# Patient Record
Sex: Male | Born: 1955 | Race: White | Hispanic: Yes | Marital: Married | Smoking: Former smoker
Health system: Southern US, Community
[De-identification: ages and names within clinical notes are randomized; demographics above are authoritative.]

## PROBLEM LIST (undated history)

## (undated) DIAGNOSIS — E119 Type 2 diabetes mellitus without complications: Secondary | ICD-10-CM

## (undated) DIAGNOSIS — Z7289 Other problems related to lifestyle: Secondary | ICD-10-CM

## (undated) DIAGNOSIS — E349 Endocrine disorder, unspecified: Secondary | ICD-10-CM

## (undated) DIAGNOSIS — E559 Vitamin D deficiency, unspecified: Secondary | ICD-10-CM

---

## 1985-06-14 HISTORY — PX: OTHER SURGICAL HISTORY: SHX169

## 2011-08-02 ENCOUNTER — Other Ambulatory Visit: Payer: Self-pay | Admitting: Family Medicine

## 2016-08-15 ENCOUNTER — Inpatient Hospital Stay (HOSPITAL_COMMUNITY)
Admission: EM | Admit: 2016-08-15 | Discharge: 2016-08-20 | DRG: 481 | Disposition: A | Discharge status: Home / Self Care | Payer: Self-pay | Attending: Orthopedic Surgery | Admitting: Orthopedic Surgery

## 2016-08-15 ENCOUNTER — Encounter (HOSPITAL_COMMUNITY): Payer: Self-pay | Admitting: Internal Medicine

## 2016-08-15 ENCOUNTER — Emergency Department (HOSPITAL_COMMUNITY): Payer: Self-pay

## 2016-08-15 DIAGNOSIS — F1410 Cocaine abuse, uncomplicated: Secondary | ICD-10-CM | POA: Diagnosis present

## 2016-08-15 DIAGNOSIS — W010XXA Fall on same level from slipping, tripping and stumbling without subsequent striking against object, initial encounter: Secondary | ICD-10-CM | POA: Diagnosis present

## 2016-08-15 DIAGNOSIS — F1910 Other psychoactive substance abuse, uncomplicated: Secondary | ICD-10-CM | POA: Diagnosis present

## 2016-08-15 DIAGNOSIS — E349 Endocrine disorder, unspecified: Secondary | ICD-10-CM | POA: Diagnosis present

## 2016-08-15 DIAGNOSIS — E559 Vitamin D deficiency, unspecified: Secondary | ICD-10-CM | POA: Diagnosis present

## 2016-08-15 DIAGNOSIS — Z87891 Personal history of nicotine dependence: Secondary | ICD-10-CM

## 2016-08-15 DIAGNOSIS — Z419 Encounter for procedure for purposes other than remedying health state, unspecified: Secondary | ICD-10-CM

## 2016-08-15 DIAGNOSIS — Z789 Other specified health status: Secondary | ICD-10-CM | POA: Diagnosis present

## 2016-08-15 DIAGNOSIS — D62 Acute posthemorrhagic anemia: Secondary | ICD-10-CM | POA: Diagnosis not present

## 2016-08-15 DIAGNOSIS — Y92000 Kitchen of unspecified non-institutional (private) residence as  the place of occurrence of the external cause: Secondary | ICD-10-CM

## 2016-08-15 DIAGNOSIS — N358 Other urethral stricture: Secondary | ICD-10-CM | POA: Diagnosis present

## 2016-08-15 DIAGNOSIS — L280 Lichen simplex chronicus: Secondary | ICD-10-CM | POA: Diagnosis present

## 2016-08-15 DIAGNOSIS — F1020 Alcohol dependence, uncomplicated: Secondary | ICD-10-CM | POA: Diagnosis present

## 2016-08-15 DIAGNOSIS — S72491A Other fracture of lower end of right femur, initial encounter for closed fracture: Principal | ICD-10-CM | POA: Diagnosis present

## 2016-08-15 DIAGNOSIS — Z7289 Other problems related to lifestyle: Secondary | ICD-10-CM | POA: Diagnosis present

## 2016-08-15 DIAGNOSIS — T148XXA Other injury of unspecified body region, initial encounter: Secondary | ICD-10-CM

## 2016-08-15 DIAGNOSIS — E119 Type 2 diabetes mellitus without complications: Secondary | ICD-10-CM | POA: Diagnosis present

## 2016-08-15 DIAGNOSIS — F17201 Nicotine dependence, unspecified, in remission: Secondary | ICD-10-CM

## 2016-08-15 DIAGNOSIS — E291 Testicular hypofunction: Secondary | ICD-10-CM | POA: Diagnosis present

## 2016-08-15 DIAGNOSIS — F17211 Nicotine dependence, cigarettes, in remission: Secondary | ICD-10-CM | POA: Diagnosis present

## 2016-08-15 DIAGNOSIS — M9701XA Periprosthetic fracture around internal prosthetic right hip joint, initial encounter: Secondary | ICD-10-CM | POA: Diagnosis present

## 2016-08-15 DIAGNOSIS — S72409A Unspecified fracture of lower end of unspecified femur, initial encounter for closed fracture: Secondary | ICD-10-CM | POA: Diagnosis present

## 2016-08-15 HISTORY — DX: Endocrine disorder, unspecified: E34.9

## 2016-08-15 HISTORY — DX: Vitamin D deficiency, unspecified: E55.9

## 2016-08-15 HISTORY — DX: Other problems related to lifestyle: Z72.89

## 2016-08-15 HISTORY — DX: Type 2 diabetes mellitus without complications: E11.9

## 2016-08-15 LAB — CBC WITH DIFFERENTIAL/PLATELET
BASOS ABS: 0 10*3/uL (ref 0.0–0.1)
BASOS PCT: 0 %
EOS ABS: 0 10*3/uL (ref 0.0–0.7)
EOS PCT: 0 %
HCT: 41.5 % (ref 39.0–52.0)
HEMOGLOBIN: 14 g/dL (ref 13.0–17.0)
LYMPHS ABS: 1.4 10*3/uL (ref 0.7–4.0)
Lymphocytes Relative: 15 %
MCH: 29.7 pg (ref 26.0–34.0)
MCHC: 33.7 g/dL (ref 30.0–36.0)
MCV: 88.1 fL (ref 78.0–100.0)
Monocytes Absolute: 0.6 10*3/uL (ref 0.1–1.0)
Monocytes Relative: 6 %
NEUTROS PCT: 79 %
Neutro Abs: 7.5 10*3/uL (ref 1.7–7.7)
PLATELETS: 200 10*3/uL (ref 150–400)
RBC: 4.71 MIL/uL (ref 4.22–5.81)
RDW: 12.9 % (ref 11.5–15.5)
WBC: 9.5 10*3/uL (ref 4.0–10.5)

## 2016-08-15 LAB — RAPID URINE DRUG SCREEN, HOSP PERFORMED
Amphetamines: NOT DETECTED
Barbiturates: NOT DETECTED
Benzodiazepines: NOT DETECTED
Cocaine: POSITIVE — AB
Opiates: NOT DETECTED
Tetrahydrocannabinol: NOT DETECTED

## 2016-08-15 LAB — COMPREHENSIVE METABOLIC PANEL WITH GFR
ALT: 39 U/L (ref 17–63)
AST: 34 U/L (ref 15–41)
Albumin: 3.4 g/dL — ABNORMAL LOW (ref 3.5–5.0)
Alkaline Phosphatase: 95 U/L (ref 38–126)
Anion gap: 11 (ref 5–15)
BUN: 9 mg/dL (ref 6–20)
CO2: 18 mmol/L — ABNORMAL LOW (ref 22–32)
Calcium: 9.1 mg/dL (ref 8.9–10.3)
Chloride: 110 mmol/L (ref 101–111)
Creatinine, Ser: 0.81 mg/dL (ref 0.61–1.24)
GFR calc Af Amer: >60 mL/min
GFR calc non Af Amer: >60 mL/min
Glucose, Bld: 173 mg/dL — ABNORMAL HIGH (ref 65–99)
Potassium: 3.8 mmol/L (ref 3.5–5.1)
Sodium: 139 mmol/L (ref 135–145)
Total Bilirubin: 0.4 mg/dL (ref 0.3–1.2)
Total Protein: 8.2 g/dL — ABNORMAL HIGH (ref 6.5–8.1)

## 2016-08-15 LAB — ETHANOL: Alcohol, Ethyl (B): 54 mg/dL — ABNORMAL HIGH

## 2016-08-15 MED ORDER — MORPHINE SULFATE (PF) 4 MG/ML IV SOLN
4.0000 mg | INTRAVENOUS | Status: DC | PRN
  Administered 2016-08-15 – 2016-08-16 (×4): 4 mg via INTRAVENOUS
  Filled 2016-08-15 (×4): qty 1

## 2016-08-15 MED ORDER — POLYETHYLENE GLYCOL 3350 17 G PO PACK
17.0000 g | PACK | Freq: Every day | ORAL | Status: DC | PRN
Start: 2016-08-15 — End: 2016-08-20

## 2016-08-15 MED ORDER — KETOROLAC TROMETHAMINE 30 MG/ML IJ SOLN
30.0000 mg | Freq: Four times a day (QID) | INTRAMUSCULAR | Status: DC | PRN
  Administered 2016-08-15 (×2): 30 mg via INTRAVENOUS
  Filled 2016-08-15 (×2): qty 1

## 2016-08-15 MED ORDER — ONDANSETRON HCL 4 MG/2ML IJ SOLN
4.0000 mg | Freq: Three times a day (TID) | INTRAMUSCULAR | Status: DC | PRN
  Administered 2016-08-15 – 2016-08-19 (×3): 4 mg via INTRAVENOUS
  Filled 2016-08-15 (×3): qty 2

## 2016-08-15 MED ORDER — INFLUENZA VAC SPLIT QUAD 0.5 ML IM SUSY
0.5000 mL | PREFILLED_SYRINGE | INTRAMUSCULAR | Status: DC
  Filled 2016-08-15: qty 0.5

## 2016-08-15 MED ORDER — FENTANYL CITRATE (PF) 100 MCG/2ML IJ SOLN
50.0000 ug | Freq: Once | INTRAMUSCULAR | Status: AC
  Administered 2016-08-15: 50 ug via INTRAVENOUS
  Filled 2016-08-15: qty 2

## 2016-08-15 MED ORDER — HEPARIN SODIUM (PORCINE) 5000 UNIT/ML IJ SOLN
5000.0000 [IU] | Freq: Three times a day (TID) | INTRAMUSCULAR | Status: DC
  Administered 2016-08-15 – 2016-08-16 (×3): 5000 [IU] via SUBCUTANEOUS
  Filled 2016-08-15 (×3): qty 1

## 2016-08-15 MED ORDER — ASPIRIN 81 MG PO CHEW
81.0000 mg | CHEWABLE_TABLET | Freq: Every day | ORAL | Status: DC
  Administered 2016-08-15 – 2016-08-20 (×5): 81 mg via ORAL
  Filled 2016-08-15 (×6): qty 1

## 2016-08-15 NOTE — H&P (Signed)
Date: 08/15/2016               Patient Name:  Jay Anderson MRN: [id number]  DOB: 1955/08/09 Age / Sex: 61 y.o., male   PCP: No primary care provider on file.         Medical Service: Internal Medicine Teaching Service         Attending Physician: Dr. Gust Rung, DO    First Contact: Dr. Noemi Chapel Pager: [phone]  Second Contact: Dr. Orest Dikes Pager: [phone]       After Hours (After 5p/  First Contact Pager: [phone]  weekends / holidays): Second Contact Pager: [phone]   Chief Complaint: right leg pain after fall  History of Present Illness:  This is a 61 year old spanish-speaking male with medical history significant for alcohol and substance abuse who presents for evaluation of right knee pain after falling. He reports he was drinking some beers at a friends house last night when he slipped and fell on some water on the kitchen floor at about midnight. Fell straight forward and landed on his right knee with immediate severe pain. Has been unable to ambulate. Denies any other epicenters of pain, weakness, numbness, dizziness or chest pain. Did not hit his head and did not lose consciousness. He has IM nail in right femur secondary to fracture in 1986.   In the emergency department, vital signs were stable (temp 98*, pulse 86, BP 123/93, respirations 19 and he was saturating 95% on RA. Plain films of right knee and femur demonstrate comminuted fracture of the distal femur just distal to the IM nail from prior repair. He was given Toradol and fentanyl with mild improvement of his discomfort. UDS positive for cocaine. Ethanol level 54. Orthopedic surgery was consulted who requested IM admission due to patients underlying substance abuse.   Meds: ASA 81 mg daily  Allergies: Allergies as of 08/15/2016  . (No Known Allergies)   Medical History: History of "little MI many years ago", History of tobacco abuse.   Family History: He denied any family history of diabetes,  hypertension, hyperlipidemia, cancers or bone disease.  Social History: Former smoker, quit "many years ago." Denied daily alcohol use. Denied any recreational drug use however UDS positive for cocaine.   Review of Systems: A complete ROS was negative except as per HPI.   Physical Exam: Blood pressure 132/81, pulse 75, temperature 98 F (36.7 C), temperature source Oral, resp. rate 17, height 5\' 6"  (1.676 m), weight 180 lb (81.6 kg), SpO2 95 %. General: Spanish-speaking male resting in bed. Uncomfortable however not in acute distress.  HENT: PERRL. EOMI. No conjunctival injection, icterus or ptosis. Oropharynx clear, mucous membranes moist.  Cardiovascular: Regular rate and rhythm. No murmur or rub appreciated. Pulmonary: CTA BL, no wheezing, crackles or rhonchi appreciated on anterior chest. Unlabored breathing.  Abdomen: Soft, non-tender and non-distended. No guarding or rigidity. +bowel sounds.  Extremities: Left lower extremity without apparent abnormality. Right knee with significant swelling however no erythema. Right knee and distal leg TTP. Can move toes however unable to lift off bed. Intact and strong distal pulses.  Skin: Warm, dry. No cyanosis.  Neuro: Sensation grossly intact. Strength intact except for RLE - unable to lift off of bed however is able to move toes. Can feel equally on both sides.  Psych: Mood normal and affect was mood congruent. Responds to questions appropriately.   Dg Knee Complete 4 Views Right: Result Date: 08/15/2016 MPRESSION: Comminuted fracture  of the femur just distal to the IM nail.   Dg Femur Min 2 Views Right: Result Date: 08/15/2016: IMPRESSION: Comminuted fracture of the distal femur just below the intramedullary nail.   Assessment & Plan by Problem: Active Problems:   Closed fracture of distal end of femur (HCC)   Tobacco abuse, in remission   Polysubstance abuse  Right Distal Femur Fracture, Closed, Commutated, following fall: As demonstrated  on plain films of right knee and femur. Just distal to IM rod which was placed in 1986. Closed fracture however patient unable to bear weight. Physical exam not suggestive of compartment syndrome, patient has intact sensation and strong peripheral pulses and compartment is not tense. Has been seen by orthopedics who recommend placement of knee immobilizer + will have knee specialist from [facility] evaluate the patient tomorrow. Will aim to control patients pain and will continue to evaluate for development of possible compartment syndrome. In addition, will be aware for risk of fat embolism as this is a long bone fracture. Encouraged patient to inform nursing staff it status were to change.  -Ortho on board, appreciate their recommendations in management of this patient -Knee immobilizer -No weight bearing -Knee specialist to see tomorrow -PT -Pain control with toradol Q6H prn and Morphine 4 mg Q2H prn -HIV Ab  Polysubstance abuse, History of Tobacco Abuse: Admitted to alcohol use overnight and ethanol level today 54 in ED. Denied daily alcohol use or symptoms of DT before. UDS positive for cocaine however he denied any recreational drug use.   ?History of MI: Reports he takes ASA 81 mg daily. No evidence for this diagnosis in EMR. He was unable to provide further details of this event. Denied having stents placed or requiring surgery.   Consults: Orthopedic surgery  DVT Prophylaxis: Heparin Diet: Heart healthy IVF: None Code status: FULL  Dispo: Admit patient to Inpatient with expected length of stay greater than 2 midnights.  SignedNoemi Chapel: Sofie Schendel, DO 08/15/2016, 9:53 AM  Pager: [phone]

## 2016-08-15 NOTE — Progress Notes (Addendum)
Patient [name] MRN: [id number] DOB/AGE: 1956-05-03 61 y.o.  Admit date: 08/15/2016  Admission Diagnoses:  Right Femur fracture  HPI: Pleasant 61 year old male pt with a past med hx of a intramedullary nail placed in 1986.  Pt does not speak English but seems to understand some.  He is accompanied by his son who interprets for him.  They report a trama in the middle of the night.  He fell and now has significant pain of his right knee.  Imaging confirms fracture below nail fixation.   Past Medical History: History reviewed. No pertinent past medical history.  Surgical History: History reviewed. No pertinent surgical history.  Family History: History reviewed. No pertinent family history.  Social History: Social History   Social History  . Marital status: Married    Spouse name: N/A  . Number of children: N/A  . Years of education: N/A   Occupational History  . Not on file.   Social History Main Topics  . Smoking status: Not on file  . Smokeless tobacco: Not on file  . Alcohol use Not on file  . Drug use: Unknown  . Sexual activity: Not on file   Other Topics Concern  . Not on file   Social History Narrative  . No narrative on file    Allergies: Patient has no allergy information on record.  Medications: I have reviewed the patient's current medications.  Vital Signs: Patient Vitals for the past 24 hrs:  BP Temp Temp src Pulse Resp SpO2 Height Weight  08/15/16 0949 132/81 - - 75 17 95 % - -  08/15/16 0915 (!) 139/102 - - 78 10 96 % - -  08/15/16 0900 140/94 - - 92 19 97 % - -  08/15/16 0734 - - - - - - 5\' 6"  (1.676 m) 81.6 kg (180 lb)  08/15/16 0730 140/90 - - 84 19 96 % - -  08/15/16 0729 123/93 98 F (36.7 C) Oral 86 24 95 % - -    Radiology: Dg Knee Complete 4 Views Right  Result Date: 08/15/2016 CLINICAL DATA:  Pt reports he slipped on a wet floor and fell, injuring his right leg. Pt reports most pain around knee and upper thigh.  Pt has IM nail in place. Patient does not speak much [name]. Pt was unable to fully straighten his knee. EXAM: RIGHT KNEE - COMPLETE 4+ VIEW COMPARISON:  None. FINDINGS: There is a comminuted oblique fracture of the distal femur, just distal to the intramedullary nail. Fracture does not appear to extend to the articular surface. Bones appear radiolucent. There is degenerative change involving medial, lateral, and patellofemoral compartments of the knee. Suspect hemarthrosis. IMPRESSION: Comminuted fracture of the femur just distal to the IM nail. Electronically Signed   By: Norva PavlovElizabeth  Brown M.D.   On: 08/15/2016 08:42   Dg Femur Min 2 Views Right  Result Date: 08/15/2016 CLINICAL DATA:  Pt reports he slipped on a wet floor and fell, injuring his right leg. Pt reports most pain around knee and upper thigh. Pt has IM nail in place. Patient does not speak much [name]. Pt was unable to fully straighten his knee. EXAM: RIGHT FEMUR 2 VIEWS COMPARISON:  None. FINDINGS: Status post remote intramedullary nail fixation of the right femur. The proximal femur is intact. There is a comminuted distal femoral fracture just below the intramedullary nail. Bones appear radiolucent. IMPRESSION: Comminuted fracture of the distal femur just below the intramedullary nail. Electronically Signed  By: [name] M.D.   On: 08/15/2016 08:49    Labs:  Recent Labs  08/15/16 0742  WBC 9.5  RBC 4.71  HCT 41.5  PLT 200    Recent Labs  08/15/16 0742  NA 139  K 3.8  CL 110  CO2 18*  BUN 9  CREATININE 0.81  GLUCOSE 173*  CALCIUM 9.1   No results for input(s): LABPT, INR in the last 72 hours.  Review of Systems: ROS  Physical Exam: Neurologically intact ABD soft Sensation intact distally Compartment soft Swelling of the right knee noted  TTP of his right knee  Assessment and Plan: Ordered knee immobilizer Medicine will admit Consulted with [name] A knee specialist [name] will  check on him tomorrow Non surgical management currently Pt should continue NWB  [name], MD [name] [phone] [name], male   DOB: 06-23-1955, 61 y.o.   MRN: [id number]    Agree with above Patient with displaced distal femur fracture Case discussed with [name].  He will review with [name] to determine definitive fracture management plan.

## 2016-08-15 NOTE — ED Triage Notes (Signed)
Pt transported from home by EMS for c/o R upper leg pain.  Per EMS pt is non english speaking speaking (spanish), pts son assisted with translation. Pt states he was out last evening drinking alcohol, pt was unable to ambulate unassisted unkn trauma, EMS noted swelling to thigh.  Possible bowel and bladder incontinence.  #18 L hand est by EMS.

## 2016-08-15 NOTE — ED Notes (Signed)
Patient back from x-ray 

## 2016-08-15 NOTE — ED Notes (Signed)
Pt did not need anything at this time  

## 2016-08-15 NOTE — Progress Notes (Signed)
Orthopedic Tech Progress Note Patient Details:  Jay Anderson August 30, 1955 [id number]  Ortho Devices Type of Ortho Device: Knee Immobilizer Ortho Device/Splint Location: rle Ortho Device/Splint Interventions: Application   Russel Morain 08/15/2016, 11:30 AM

## 2016-08-15 NOTE — ED Notes (Signed)
Called ortho tech 

## 2016-08-15 NOTE — ED Notes (Signed)
Ordered lunch tray 

## 2016-08-15 NOTE — ED Notes (Signed)
Patient had BM and urinated. Continent.

## 2016-08-15 NOTE — ED Provider Notes (Signed)
[facility]-EMERGENCY DEPT Provider Note   CSN: [id number] Arrival date & time: 08/15/16  0707     History   Chief Complaint Chief Complaint  Patient presents with  . Leg Pain    HPI Jay Anderson is a 61 y.o. male.  HPI Patient presents with right leg swelling and pain. Started last night after fall. States he was drinking several beers and had a ground-level fall onto his right leg. Denies any head or neck injury or pain. Has been unable to ambulate on the leg. Denies any focal weakness or numbness. History reviewed. No pertinent past medical history.  There are no active problems to display for this patient.   History reviewed. No pertinent surgical history.     Home Medications    Prior to Admission medications   Not on File    Family History History reviewed. No pertinent family history.  Social History Social History  Substance Use Topics  . Smoking status: Not on file  . Smokeless tobacco: Not on file  . Alcohol use Not on file     Allergies   Patient has no allergy information on record.   Review of Systems Review of Systems  Constitutional: Negative for chills and fever.  Respiratory: Negative for shortness of breath.   Cardiovascular: Positive for leg swelling. Negative for chest pain and palpitations.  Gastrointestinal: Negative for abdominal pain, diarrhea and nausea.  Musculoskeletal: Positive for arthralgias. Negative for back pain and neck pain.  Skin: Negative for rash and wound.  Neurological: Negative for dizziness, weakness, light-headedness, numbness and headaches.  All other systems reviewed and are negative.    Physical Exam Updated Vital Signs BP (!) 139/102   Pulse 78   Temp 98 F (36.7 C) (Oral)   Resp 10   Ht 5\' 6"  (1.676 m)   Wt 180 lb (81.6 kg)   SpO2 96%   BMI 29.05 kg/m   Physical Exam  Constitutional: He is oriented to person, place, and time. He appears well-developed and well-nourished.  HENT:  Head:  Normocephalic and atraumatic.  Mouth/Throat: Oropharynx is clear and moist.  No obvious head injury.  Eyes: EOM are normal. Pupils are equal, round, and reactive to light.  Neck: Normal range of motion. Neck supple.  No midline posterior cervical tenderness to palpation.  Cardiovascular: Normal rate and regular rhythm.  Exam reveals no gallop and no friction rub.   No murmur heard. Pulmonary/Chest: Effort normal and breath sounds normal. No respiratory distress. He has no wheezes. He has no rales. He exhibits no tenderness.  Abdominal: Soft. Bowel sounds are normal. There is no tenderness. There is no rebound and no guarding.  Musculoskeletal: Normal range of motion. He exhibits edema and tenderness.  Swollen right knee and distal femur. Decreased range of motion due to pain. Tender to palpation over the knee and distal femur. 2+ dorsalis pedis and posterior tibial pulses bilaterally.  Neurological: He is alert and oriented to person, place, and time.  Sensation fully intact. Moving toes in both feet. Decreased movement of the right leg due to pain. Otherwise moving all extremities freely.  Skin: Skin is warm and dry. Capillary refill takes less than 2 seconds. No rash noted. No erythema.  Psychiatric: He has a normal mood and affect. His behavior is normal.  Nursing note and vitals reviewed.    ED Treatments / Results  Labs (all labs ordered are listed, but only abnormal results are displayed) Labs Reviewed  COMPREHENSIVE METABOLIC PANEL - Abnormal; Notable  for the following:       Result Value   CO2 18 (*)    Glucose, Bld 173 (*)    Total Protein 8.2 (*)    Albumin 3.4 (*)    All other components within normal limits  ETHANOL - Abnormal; Notable for the following:    Alcohol, Ethyl (B) 54 (*)    All other components within normal limits  RAPID URINE DRUG SCREEN, HOSP PERFORMED - Abnormal; Notable for the following:    Cocaine POSITIVE (*)    All other components within normal  limits  CBC WITH DIFFERENTIAL/PLATELET    EKG  EKG Interpretation None       Radiology Dg Knee Complete 4 Views Right  Result Date: 08/15/2016 CLINICAL DATA:  Pt reports he slipped on a wet floor and fell, injuring his right leg. Pt reports most pain around knee and upper thigh. Pt has IM nail in place. Patient does not speak much [name]. Pt was unable to fully straighten his knee. EXAM: RIGHT KNEE - COMPLETE 4+ VIEW COMPARISON:  None. FINDINGS: There is a comminuted oblique fracture of the distal femur, just distal to the intramedullary nail. Fracture does not appear to extend to the articular surface. Bones appear radiolucent. There is degenerative change involving medial, lateral, and patellofemoral compartments of the knee. Suspect hemarthrosis. IMPRESSION: Comminuted fracture of the femur just distal to the IM nail. Electronically Signed   By: Norva PavlovElizabeth  Brown M.D.   On: 08/15/2016 08:42   Dg Femur Min 2 Views Right  Result Date: 08/15/2016 CLINICAL DATA:  Pt reports he slipped on a wet floor and fell, injuring his right leg. Pt reports most pain around knee and upper thigh. Pt has IM nail in place. Patient does not speak much [name]. Pt was unable to fully straighten his knee. EXAM: RIGHT FEMUR 2 VIEWS COMPARISON:  None. FINDINGS: Status post remote intramedullary nail fixation of the right femur. The proximal femur is intact. There is a comminuted distal femoral fracture just below the intramedullary nail. Bones appear radiolucent. IMPRESSION: Comminuted fracture of the distal femur just below the intramedullary nail. Electronically Signed   By: Norva PavlovElizabeth  Brown M.D.   On: 08/15/2016 08:49    Procedures Procedures (including critical care time)  Medications Ordered in ED Medications  fentaNYL (SUBLIMAZE) injection 50 mcg (50 mcg Intravenous Given 08/15/16 0918)     Initial Impression / Assessment and Plan / ED Course  I have reviewed the triage vital signs and the nursing  notes.  Pertinent labs & imaging results that were available during my care of the patient were reviewed by me and considered in my medical decision making (see chart for details).   Discussed with Dr. Shon BatonBrooks who will see patient in the emergency department. Advised to have medicine service admit. Discussed with internal medicine teaching service resident. Will see patient in emergency department and admit.    Final Clinical Impressions(s) / ED Diagnoses   Final diagnoses:  Closed fracture of distal end of femur, unspecified fracture morphology, initial encounter [facility](HCC)    New Prescriptions New Prescriptions   No medications on file     Loren Raceravid Jaquana Geiger, MD 08/15/16 [phone]

## 2016-08-16 ENCOUNTER — Encounter (HOSPITAL_COMMUNITY): Payer: Self-pay | Admitting: *Deleted

## 2016-08-16 DIAGNOSIS — Z9660 Presence of unspecified orthopedic joint implant: Secondary | ICD-10-CM

## 2016-08-16 DIAGNOSIS — F1410 Cocaine abuse, uncomplicated: Secondary | ICD-10-CM

## 2016-08-16 DIAGNOSIS — S72401A Unspecified fracture of lower end of right femur, initial encounter for closed fracture: Secondary | ICD-10-CM

## 2016-08-16 DIAGNOSIS — X58XXXA Exposure to other specified factors, initial encounter: Secondary | ICD-10-CM

## 2016-08-16 DIAGNOSIS — F1010 Alcohol abuse, uncomplicated: Secondary | ICD-10-CM

## 2016-08-16 LAB — HIV ANTIBODY (ROUTINE TESTING W REFLEX): HIV Screen 4th Generation wRfx: NONREACTIVE

## 2016-08-16 LAB — BASIC METABOLIC PANEL
Anion gap: 4 — ABNORMAL LOW (ref 5–15)
BUN: 15 mg/dL (ref 6–20)
CALCIUM: 8.7 mg/dL — AB (ref 8.9–10.3)
CO2: 26 mmol/L (ref 22–32)
CREATININE: 1.07 mg/dL (ref 0.61–1.24)
Chloride: 109 mmol/L (ref 101–111)
GFR calc non Af Amer: >60 mL/min (ref >60)
GLUCOSE: 121 mg/dL — AB (ref 65–99)
Potassium: 3.7 mmol/L (ref 3.5–5.1)
Sodium: 139 mmol/L (ref 135–145)

## 2016-08-16 LAB — CBC
HCT: 40.3 % (ref 39.0–52.0)
Hemoglobin: 13.3 g/dL (ref 13.0–17.0)
MCH: 29.4 pg (ref 26.0–34.0)
MCHC: 33 g/dL (ref 30.0–36.0)
MCV: 89.2 fL (ref 78.0–100.0)
PLATELETS: 191 10*3/uL (ref 150–400)
RBC: 4.52 MIL/uL (ref 4.22–5.81)
RDW: 13.2 % (ref 11.5–15.5)
WBC: 6.8 10*3/uL (ref 4.0–10.5)

## 2016-08-16 LAB — SURGICAL PCR SCREEN
MRSA, PCR: NEGATIVE
Staphylococcus aureus: NEGATIVE

## 2016-08-16 MED ORDER — OXYCODONE-ACETAMINOPHEN 5-325 MG PO TABS
1.0000 | ORAL_TABLET | Freq: Four times a day (QID) | ORAL | Status: DC | PRN
  Administered 2016-08-16: 2 via ORAL
  Administered 2016-08-17: 1 via ORAL
  Administered 2016-08-17 – 2016-08-20 (×7): 2 via ORAL
  Filled 2016-08-16 (×4): qty 2
  Filled 2016-08-16: qty 1
  Filled 2016-08-16 (×5): qty 2

## 2016-08-16 MED ORDER — HYDROMORPHONE HCL 2 MG/ML IJ SOLN
1.0000 mg | INTRAMUSCULAR | Status: DC | PRN
  Administered 2016-08-16: 2 mg via INTRAVENOUS
  Administered 2016-08-17 (×2): 1 mg via INTRAVENOUS
  Administered 2016-08-17 – 2016-08-19 (×3): 2 mg via INTRAVENOUS
  Administered 2016-08-19: 1 mg via INTRAVENOUS
  Filled 2016-08-16 (×7): qty 1

## 2016-08-16 MED ORDER — LACTATED RINGERS IV SOLN
INTRAVENOUS | Status: DC
  Administered 2016-08-16 – 2016-08-18 (×6): via INTRAVENOUS

## 2016-08-16 MED ORDER — ENSURE ENLIVE PO LIQD
237.0000 mL | Freq: Two times a day (BID) | ORAL | Status: DC
  Administered 2016-08-16: 237 mL via ORAL

## 2016-08-16 MED ORDER — LORAZEPAM 1 MG PO TABS
1.0000 mg | ORAL_TABLET | Freq: Four times a day (QID) | ORAL | Status: AC | PRN
  Administered 2016-08-18: 1 mg via ORAL
  Filled 2016-08-16: qty 1

## 2016-08-16 MED ORDER — LORAZEPAM 2 MG/ML IJ SOLN: 1.0000 mg | Freq: Four times a day (QID) | INTRAMUSCULAR | Status: AC | PRN

## 2016-08-16 MED ORDER — CEFAZOLIN SODIUM-DEXTROSE 2-4 GM/100ML-% IV SOLN
2.0000 g | Freq: Once | INTRAVENOUS | Status: AC
Start: 2016-08-17 — End: 2016-08-17
  Administered 2016-08-17: 2 g via INTRAVENOUS
  Filled 2016-08-16: qty 100

## 2016-08-16 MED ORDER — METHOCARBAMOL 500 MG PO TABS
500.0000 mg | ORAL_TABLET | Freq: Four times a day (QID) | ORAL | Status: DC | PRN
  Administered 2016-08-17 – 2016-08-18 (×3): 500 mg via ORAL
  Administered 2016-08-19 (×3): 1000 mg via ORAL
  Filled 2016-08-16 (×2): qty 2
  Filled 2016-08-16 (×2): qty 1
  Filled 2016-08-16: qty 2
  Filled 2016-08-16: qty 1

## 2016-08-16 NOTE — Progress Notes (Signed)
PT Cancellation Note  Patient Details Name: Lubertha Basquelfonso Gabbert MRN: [id number] DOB: 1956-05-30   Cancelled Treatment:    Reason Eval/Treat Not Completed: Other (comment)   Orders received and chart reviewed;  Noted for OR tomorrow;  Will plan to see for PT postop;   Thanks,  Van ClinesHolly Christen Wardrop, [place] CarolinaPT  Acute Rehabilitation Services Pager [phone] Office [phone]    Levi AlandHolly H Diem Pagnotta 08/16/2016, 12:14 PM

## 2016-08-16 NOTE — Progress Notes (Signed)
Pt had one episode of vomiting

## 2016-08-16 NOTE — Progress Notes (Signed)
   Subjective: [name] was seen and evaluated today at bedside. 2 family members present who assisted with translation. [name] continues to complain of significant pain of his right LE. Reports morphine is barely controlling his pain for longer than 30 minutes. Wants to know if will be managed surgically and wishes it to be done as soon as possible due to pain.   Objective:  Vital signs in last 24 hours: Vitals:   08/15/16 1445 08/15/16 1543 08/15/16 2320 08/16/16 0511  BP:  (!) 152/94 114/70 130/86  Pulse: 86 85 73 80  Resp: 14 15 16 16   Temp:  98.2 F (36.8 C) 98.1 F (36.7 C) 98.5 F (36.9 C)  TempSrc:  Oral Oral Oral  SpO2: 95% 96% 93% 96%  Weight:      Height:       General: Appears uncomfortable and in pain although not acutely distressed.  Cardiovascular: Regular rate and rhythm. No murmur or rub appreciated. Pulmonary: CTA BL on anterior chest. Unlabored breathing.  Abdomen: Soft, non-tender and non-distended. Extremities: Intact distal pulses. Sensation intact BL LE. RLE with knee immobilizer. Thigh and calf without tightening to suggest increased compartmental pressure.  Skin: Warm, dry. No cyanosis.  Psych: Mood normal and affect was mood congruent. Responds to questions appropriately.   Assessment/Plan:  Principal Problem:   Closed fracture of distal end of femur (HCC) Active Problems:   Tobacco abuse, in remission   Polysubstance abuse  Closed Distal Femur Fracture, IM Nail present: Comminuted fracture of distal femur just below IM nail (placed in 1986). Knee immobilizer placed yesterday by ortho. Patient continues to complain of significant pain and that morphine doesn't last longer than 30 minutes. Distal pulses intact. Still has sensation BL LE. Thigh and calf not tight suggesting increased compartmental pressure.  -Appears patient on schedule for surgery tomorrow, currently awaiting note from ortho. Will follow their recommendations. Appreciate their  expertise in management of this patient.  -Change pain control to longer acting agent + different IV agent -Oxycodone/Acetaminophen 5-325 1-2 tabs Q6H PRN -IV Dilaudid 1-2 mg Q3H prn -Knee immobilizer for now -NWB  Polysubstance abuse: UDS +cocaine and EtOH level yesterday 54. Pt and son indicate he is an occasional drinker. No evidence for WD currently however if patient were to develop tremors/hypertension - would include alcohol withdrawal on differential and would treat with Ativan.   Dispo: Anticipated discharge uncertain. Pending surgery and outcome. Case discussed with PA Jay Anderson of [facility] at 11:40 am who will assume care of patient. Please consult IM as needed.   Jay Vultaggio, DO 08/16/2016, 10:58 AM Pager: [phone]

## 2016-08-16 NOTE — Progress Notes (Signed)
Initial Nutrition Assessment  DOCUMENTATION CODES:   Not applicable  INTERVENTION:  Provide Ensure Enlive po BID, each supplement provides 350 kcal and 20 grams of protein.  Encourage adequate PO intake.   NUTRITION DIAGNOSIS:   Increased nutrient needs related to  (surgery) as evidenced by estimated needs.  GOAL:   Patient will meet greater than or equal to 90% of their needs  MONITOR:   PO intake, Supplement acceptance, Labs, Weight trends, Skin, I & O's  REASON FOR ASSESSMENT:   Malnutrition Screening Tool    ASSESSMENT:   61 year old male pt ETOH/drug abuse, with a past med hx of a intramedullary nail placed in 1986.  Pt does not speak English but seems to understand some.  They report a trama in the middle of the night.  He fell and now has significant pain of his right knee.  Imaging confirms fracture below nail fixation.   Pt is currently on a full liquid diet. Plans for OR tomorrow. Family at bedside. Pt reports having a decreased appetite which has been ongoing over the past 3 months. He reports usual consumption of at least 1 meal a day. Pt reports having a 10-15 lb weight loss over the past 3 months. Pt with a 5.3-7.7% weight loss in 3 months. No weight history available to verify reported weight loss. Pt is agreeable to nutritional supplements to aid in adequate nutrition.   Pt with no observed significant fat or muscle mass loss.   Labs and medications reviewed.   Diet Order:  Diet full liquid Room service appropriate? Yes; Fluid consistency: Thin Diet NPO time specified Except for: Sips with Meds  Skin:  Reviewed, no issues  Last BM:  3/3  Height:   Ht Readings from Last 1 Encounters:  08/15/16 5\' 6"  (1.676 m)    Weight:   Wt Readings from Last 1 Encounters:  08/15/16 180 lb (81.6 kg)    Ideal Body Weight:  64.5 kg  BMI:  Body mass index is 29.05 kg/m.  Estimated Nutritional Needs:   Kcal:  1950-2150  Protein:  95-105 grams  Fluid:   1.9 - 2.1 L/day  EDUCATION NEEDS:   No education needs identified at this time  Roslyn SmilingStephanie Corina Stacy, MS, RD, LDN Pager # [phone] After hours/ weekend pager # [phone]

## 2016-08-16 NOTE — Progress Notes (Signed)
Unable to complete surgery consent form, as no orders have been placed. Blood consent, however, has been completed and can be found in chart.
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
## 2016-08-17 ENCOUNTER — Inpatient Hospital Stay (HOSPITAL_COMMUNITY): Payer: Self-pay

## 2016-08-17 ENCOUNTER — Inpatient Hospital Stay (HOSPITAL_COMMUNITY): Payer: Self-pay | Admitting: Anesthesiology

## 2016-08-17 ENCOUNTER — Encounter (HOSPITAL_COMMUNITY)
Admission: EM | Disposition: A | Discharge status: Home / Self Care | Payer: Self-pay | Source: Home / Self Care | Attending: Orthopedic Surgery

## 2016-08-17 HISTORY — PX: FEMUR IM NAIL: SHX1597

## 2016-08-17 HISTORY — PX: HARDWARE REMOVAL: SHX979

## 2016-08-17 LAB — COMPREHENSIVE METABOLIC PANEL
ALBUMIN: 3.2 g/dL — AB (ref 3.5–5.0)
ALK PHOS: 81 U/L (ref 38–126)
ALT: 30 U/L (ref 17–63)
AST: 28 U/L (ref 15–41)
Anion gap: 8 (ref 5–15)
BILIRUBIN TOTAL: 0.9 mg/dL (ref 0.3–1.2)
BUN: 15 mg/dL (ref 6–20)
CALCIUM: 9.2 mg/dL (ref 8.9–10.3)
CO2: 28 mmol/L (ref 22–32)
Chloride: 106 mmol/L (ref 101–111)
Creatinine, Ser: 1.08 mg/dL (ref 0.61–1.24)
GFR calc Af Amer: >60 mL/min (ref >60)
GFR calc non Af Amer: >60 mL/min (ref >60)
GLUCOSE: 117 mg/dL — AB (ref 65–99)
Potassium: 4.1 mmol/L (ref 3.5–5.1)
Sodium: 142 mmol/L (ref 135–145)
Total Protein: 7.8 g/dL (ref 6.5–8.1)

## 2016-08-17 LAB — VITAMIN D 25 HYDROXY (VIT D DEFICIENCY, FRACTURES): Vit D, 25-Hydroxy: 13.8 ng/mL — ABNORMAL LOW (ref 30.0–100.0)

## 2016-08-17 LAB — CBC
HEMATOCRIT: 40.6 % (ref 39.0–52.0)
Hemoglobin: 13.3 g/dL (ref 13.0–17.0)
MCH: 29.8 pg (ref 26.0–34.0)
MCHC: 32.8 g/dL (ref 30.0–36.0)
MCV: 90.8 fL (ref 78.0–100.0)
Platelets: 175 10*3/uL (ref 150–400)
RBC: 4.47 MIL/uL (ref 4.22–5.81)
RDW: 13.5 % (ref 11.5–15.5)
WBC: 5.4 10*3/uL (ref 4.0–10.5)

## 2016-08-17 LAB — GLUCOSE, CAPILLARY: GLUCOSE-CAPILLARY: 167 mg/dL — AB (ref 65–99)

## 2016-08-17 LAB — PROTIME-INR
INR: 1.04
Prothrombin Time: 13.7 seconds (ref 11.4–15.2)

## 2016-08-17 LAB — TYPE AND SCREEN
ABO/RH(D): O POS
Antibody Screen: NEGATIVE

## 2016-08-17 LAB — SURGICAL PCR SCREEN
MRSA, PCR: NEGATIVE
Staphylococcus aureus: NEGATIVE

## 2016-08-17 LAB — APTT: aPTT: 32 seconds (ref 24–36)

## 2016-08-17 LAB — HEPATITIS PANEL, ACUTE
HEP B S AG: NEGATIVE
Hep A IgM: NEGATIVE
Hep B C IgM: NEGATIVE

## 2016-08-17 LAB — ABO/RH: ABO/RH(D): O POS

## 2016-08-17 LAB — HEMOGLOBIN A1C
HEMOGLOBIN A1C: 7.5 % — AB (ref 4.8–5.6)
Mean Plasma Glucose: 169 mg/dL

## 2016-08-17 SURGERY — REMOVAL, HARDWARE
Anesthesia: General | Site: Leg Upper | Laterality: Right

## 2016-08-17 MED ORDER — 0.9 % SODIUM CHLORIDE (POUR BTL) OPTIME
TOPICAL | Status: DC | PRN
  Administered 2016-08-17: 1000 mL

## 2016-08-17 MED ORDER — FENTANYL CITRATE (PF) 100 MCG/2ML IJ SOLN
INTRAMUSCULAR | Status: AC
  Filled 2016-08-17: qty 4

## 2016-08-17 MED ORDER — PROPOFOL 10 MG/ML IV BOLUS
INTRAVENOUS | Status: DC | PRN
Start: 2016-08-17 — End: 2016-08-17
  Administered 2016-08-17: 130 mg via INTRAVENOUS

## 2016-08-17 MED ORDER — METOCLOPRAMIDE HCL 5 MG/ML IJ SOLN: 5.0000 mg | Freq: Three times a day (TID) | INTRAMUSCULAR | Status: DC | PRN

## 2016-08-17 MED ORDER — METOCLOPRAMIDE HCL 5 MG PO TABS
5.0000 mg | ORAL_TABLET | Freq: Three times a day (TID) | ORAL | Status: DC | PRN
  Administered 2016-08-17: 10 mg via ORAL
  Filled 2016-08-17: qty 2

## 2016-08-17 MED ORDER — ACETAMINOPHEN 325 MG PO TABS: 650.0000 mg | ORAL_TABLET | Freq: Four times a day (QID) | ORAL | Status: DC | PRN

## 2016-08-17 MED ORDER — MAGNESIUM CITRATE PO SOLN: 1.0000 | Freq: Once | ORAL | Status: DC | PRN

## 2016-08-17 MED ORDER — POLYETHYLENE GLYCOL 3350 17 G PO PACK
17.0000 g | PACK | Freq: Every day | ORAL | Status: DC
  Administered 2016-08-18 – 2016-08-20 (×3): 17 g via ORAL
  Filled 2016-08-17 (×3): qty 1

## 2016-08-17 MED ORDER — LIDOCAINE 2% (20 MG/ML) 5 ML SYRINGE
INTRAMUSCULAR | Status: AC
  Filled 2016-08-17: qty 10

## 2016-08-17 MED ORDER — INSULIN ASPART 100 UNIT/ML ~~LOC~~ SOLN
0.0000 [IU] | Freq: Three times a day (TID) | SUBCUTANEOUS | Status: DC
  Administered 2016-08-17: 3 [IU] via SUBCUTANEOUS
  Administered 2016-08-18 (×2): 2 [IU] via SUBCUTANEOUS
  Administered 2016-08-18: 3 [IU] via SUBCUTANEOUS
  Administered 2016-08-19: 5 [IU] via SUBCUTANEOUS
  Administered 2016-08-19 – 2016-08-20 (×3): 3 [IU] via SUBCUTANEOUS

## 2016-08-17 MED ORDER — ONDANSETRON HCL 4 MG PO TABS: 4.0000 mg | ORAL_TABLET | Freq: Four times a day (QID) | ORAL | Status: DC | PRN

## 2016-08-17 MED ORDER — ONDANSETRON HCL 4 MG/2ML IJ SOLN
INTRAMUSCULAR | Status: AC
  Filled 2016-08-17: qty 2

## 2016-08-17 MED ORDER — FENTANYL CITRATE (PF) 100 MCG/2ML IJ SOLN
INTRAMUSCULAR | Status: DC | PRN
  Administered 2016-08-17: 50 ug via INTRAVENOUS
  Administered 2016-08-17: 100 ug via INTRAVENOUS
  Administered 2016-08-17 (×3): 50 ug via INTRAVENOUS

## 2016-08-17 MED ORDER — PHENYLEPHRINE 40 MCG/ML (10ML) SYRINGE FOR IV PUSH (FOR BLOOD PRESSURE SUPPORT)
PREFILLED_SYRINGE | INTRAVENOUS | Status: AC
  Filled 2016-08-17: qty 20

## 2016-08-17 MED ORDER — MIDAZOLAM HCL 5 MG/5ML IJ SOLN
INTRAMUSCULAR | Status: DC | PRN
  Administered 2016-08-17: 2 mg via INTRAVENOUS

## 2016-08-17 MED ORDER — EPHEDRINE 5 MG/ML INJ
INTRAVENOUS | Status: AC
  Filled 2016-08-17: qty 10

## 2016-08-17 MED ORDER — LIDOCAINE HCL (CARDIAC) 20 MG/ML IV SOLN
INTRAVENOUS | Status: DC | PRN
  Administered 2016-08-17: 100 mg via INTRATRACHEAL

## 2016-08-17 MED ORDER — MIDAZOLAM HCL 2 MG/2ML IJ SOLN
INTRAMUSCULAR | Status: AC
  Filled 2016-08-17: qty 2

## 2016-08-17 MED ORDER — ORAL CARE MOUTH RINSE
15.0000 mL | Freq: Two times a day (BID) | OROMUCOSAL | Status: DC
  Administered 2016-08-17 – 2016-08-20 (×4): 15 mL via OROMUCOSAL

## 2016-08-17 MED ORDER — PROPOFOL 10 MG/ML IV BOLUS
INTRAVENOUS | Status: AC
  Filled 2016-08-17: qty 20

## 2016-08-17 MED ORDER — SUCCINYLCHOLINE CHLORIDE 20 MG/ML IJ SOLN
INTRAMUSCULAR | Status: DC | PRN
  Administered 2016-08-17: 100 mg via INTRAVENOUS

## 2016-08-17 MED ORDER — PHENYLEPHRINE HCL 10 MG/ML IJ SOLN
INTRAMUSCULAR | Status: DC | PRN
  Administered 2016-08-17 (×2): 120 ug via INTRAVENOUS

## 2016-08-17 MED ORDER — ONDANSETRON HCL 4 MG/2ML IJ SOLN
4.0000 mg | Freq: Four times a day (QID) | INTRAMUSCULAR | Status: DC | PRN
  Administered 2016-08-17: 4 mg via INTRAVENOUS
  Filled 2016-08-17: qty 2

## 2016-08-17 MED ORDER — DOCUSATE SODIUM 100 MG PO CAPS
100.0000 mg | ORAL_CAPSULE | Freq: Two times a day (BID) | ORAL | Status: DC
  Administered 2016-08-17 – 2016-08-20 (×6): 100 mg via ORAL
  Filled 2016-08-17 (×6): qty 1

## 2016-08-17 MED ORDER — HYDROMORPHONE HCL 1 MG/ML IJ SOLN
INTRAMUSCULAR | Status: AC
  Filled 2016-08-17: qty 1

## 2016-08-17 MED ORDER — CEFAZOLIN IN D5W 1 GM/50ML IV SOLN
1.0000 g | Freq: Four times a day (QID) | INTRAVENOUS | Status: AC
  Administered 2016-08-17 – 2016-08-18 (×3): 1 g via INTRAVENOUS
  Filled 2016-08-17 (×3): qty 50

## 2016-08-17 MED ORDER — PHENYLEPHRINE HCL 10 MG/ML IJ SOLN
INTRAVENOUS | Status: DC | PRN
  Administered 2016-08-17: 25 ug/min via INTRAVENOUS

## 2016-08-17 MED ORDER — FENTANYL CITRATE (PF) 100 MCG/2ML IJ SOLN
INTRAMUSCULAR | Status: AC
  Filled 2016-08-17: qty 2

## 2016-08-17 MED ORDER — LACTATED RINGERS IV SOLN
INTRAVENOUS | Status: DC
  Administered 2016-08-17: 11:00:00 via INTRAVENOUS

## 2016-08-17 MED ORDER — ENOXAPARIN SODIUM 40 MG/0.4ML ~~LOC~~ SOLN
40.0000 mg | SUBCUTANEOUS | Status: DC
  Administered 2016-08-18 – 2016-08-20 (×3): 40 mg via SUBCUTANEOUS
  Filled 2016-08-17 (×3): qty 0.4

## 2016-08-17 MED ORDER — ACETAMINOPHEN 650 MG RE SUPP: 650.0000 mg | Freq: Four times a day (QID) | RECTAL | Status: DC | PRN

## 2016-08-17 MED ORDER — ONDANSETRON HCL 4 MG/2ML IJ SOLN
INTRAMUSCULAR | Status: DC | PRN
  Administered 2016-08-17: 4 mg via INTRAVENOUS

## 2016-08-17 MED ORDER — SUGAMMADEX SODIUM 200 MG/2ML IV SOLN
INTRAVENOUS | Status: DC | PRN
  Administered 2016-08-17: 200 mg via INTRAVENOUS

## 2016-08-17 MED ORDER — SUGAMMADEX SODIUM 200 MG/2ML IV SOLN
INTRAVENOUS | Status: AC
  Filled 2016-08-17: qty 2

## 2016-08-17 MED ORDER — SUCCINYLCHOLINE CHLORIDE 200 MG/10ML IV SOSY
PREFILLED_SYRINGE | INTRAVENOUS | Status: AC
  Filled 2016-08-17: qty 10

## 2016-08-17 MED ORDER — ROCURONIUM BROMIDE 100 MG/10ML IV SOLN
INTRAVENOUS | Status: DC | PRN
  Administered 2016-08-17: 20 mg via INTRAVENOUS
  Administered 2016-08-17: 50 mg via INTRAVENOUS
  Administered 2016-08-17: 15 mg via INTRAVENOUS

## 2016-08-17 MED ORDER — ROCURONIUM BROMIDE 50 MG/5ML IV SOSY
PREFILLED_SYRINGE | INTRAVENOUS | Status: AC
  Filled 2016-08-17: qty 15

## 2016-08-17 MED ORDER — EPHEDRINE SULFATE 50 MG/ML IJ SOLN
INTRAMUSCULAR | Status: DC | PRN
  Administered 2016-08-17: 7.5 mg via INTRAVENOUS
  Administered 2016-08-17: 12.5 mg via INTRAVENOUS

## 2016-08-17 MED ORDER — HYDROMORPHONE HCL 1 MG/ML IJ SOLN
0.2500 mg | INTRAMUSCULAR | Status: DC | PRN
  Administered 2016-08-17 (×4): 0.5 mg via INTRAVENOUS

## 2016-08-17 SURGICAL SUPPLY — 92 items
BANDAGE ACE 4X5 VEL STRL LF (GAUZE/BANDAGES/DRESSINGS) ×3 IMPLANT
BANDAGE ACE 6X5 VEL STRL LF (GAUZE/BANDAGES/DRESSINGS) ×3 IMPLANT
BANDAGE ESMARK 6X9 LF (GAUZE/BANDAGES/DRESSINGS) ×1 IMPLANT
BIT DRILL CALIBRATED 4.3MMX365 (DRILL) IMPLANT
BIT DRILL CROWE PNT TWST 4.5MM (DRILL) IMPLANT
BNDG CMPR 9X6 STRL LF SNTH (GAUZE/BANDAGES/DRESSINGS)
BNDG COHESIVE 6X5 TAN STRL LF (GAUZE/BANDAGES/DRESSINGS) ×3 IMPLANT
BNDG ESMARK 6X9 LF (GAUZE/BANDAGES/DRESSINGS)
BNDG GAUZE ELAST 4 BULKY (GAUZE/BANDAGES/DRESSINGS) ×4 IMPLANT
BRUSH SCRUB DISP (MISCELLANEOUS) ×6 IMPLANT
CATH COUDE FOLEY 5CC 14FR (CATHETERS) ×2 IMPLANT
CATH FOLEY 2WAY SLVR  5CC 12FR (CATHETERS) ×2
CATH FOLEY 2WAY SLVR 5CC 12FR (CATHETERS) IMPLANT
CATH FOLEY LATEX FREE 14FR (CATHETERS) ×3
CATH FOLEY LF 14FR (CATHETERS) IMPLANT
CLOSURE WOUND 1/2 X4 (GAUZE/BANDAGES/DRESSINGS)
COVER PERINEAL POST (MISCELLANEOUS) ×1 IMPLANT
COVER SURGICAL LIGHT HANDLE (MISCELLANEOUS) ×6 IMPLANT
CUFF TOURNIQUET SINGLE 18IN (TOURNIQUET CUFF) IMPLANT
CUFF TOURNIQUET SINGLE 24IN (TOURNIQUET CUFF) IMPLANT
CUFF TOURNIQUET SINGLE 34IN LL (TOURNIQUET CUFF) IMPLANT
DRAPE C-ARM 42X72 X-RAY (DRAPES) ×2 IMPLANT
DRAPE C-ARMOR (DRAPES) ×3 IMPLANT
DRAPE ORTHO SPLIT 77X108 STRL (DRAPES) ×9
DRAPE PROXIMA HALF (DRAPES) IMPLANT
DRAPE SURG ORHT 6 SPLT 77X108 (DRAPES) ×2 IMPLANT
DRAPE U-SHAPE 47X51 STRL (DRAPES) ×3 IMPLANT
DRILL CALIBRATED 4.3MMX365 (DRILL) ×3
DRILL CROWE POINT TWIST 4.5MM (DRILL) ×6
DRSG ADAPTIC 3X8 NADH LF (GAUZE/BANDAGES/DRESSINGS) ×3 IMPLANT
DRSG MEPILEX BORDER 4X4 (GAUZE/BANDAGES/DRESSINGS) ×3 IMPLANT
DRSG MEPILEX BORDER 4X8 (GAUZE/BANDAGES/DRESSINGS) ×3 IMPLANT
DRSG MEPITEL 4X7.2 (GAUZE/BANDAGES/DRESSINGS) ×2 IMPLANT
ELECT REM PT RETURN 9FT ADLT (ELECTROSURGICAL) ×3
ELECTRODE REM PT RTRN 9FT ADLT (ELECTROSURGICAL) ×1 IMPLANT
GAUZE SPONGE 4X4 12PLY STRL (GAUZE/BANDAGES/DRESSINGS) ×3 IMPLANT
GLOVE BIO SURGEON STRL SZ7.5 (GLOVE) ×5 IMPLANT
GLOVE BIO SURGEON STRL SZ8 (GLOVE) ×3 IMPLANT
GLOVE BIOGEL PI IND STRL 7.5 (GLOVE) ×1 IMPLANT
GLOVE BIOGEL PI IND STRL 8 (GLOVE) ×1 IMPLANT
GLOVE BIOGEL PI INDICATOR 7.5 (GLOVE) ×2
GLOVE BIOGEL PI INDICATOR 8 (GLOVE) ×2
GLOVE SURG SS PI 8.0 STRL IVOR (GLOVE) ×2 IMPLANT
GOWN STRL REUS W/ TWL LRG LVL3 (GOWN DISPOSABLE) ×2 IMPLANT
GOWN STRL REUS W/ TWL XL LVL3 (GOWN DISPOSABLE) ×1 IMPLANT
GOWN STRL REUS W/TWL LRG LVL3 (GOWN DISPOSABLE) ×6
GOWN STRL REUS W/TWL XL LVL3 (GOWN DISPOSABLE) ×3
GUIDEWIRE BEAD TIP (WIRE) ×2 IMPLANT
KIT BASIN OR (CUSTOM PROCEDURE TRAY) ×3 IMPLANT
KIT ROOM TURNOVER OR (KITS) ×3 IMPLANT
MANIFOLD NEPTUNE II (INSTRUMENTS) ×1 IMPLANT
NAIL FEM RETRO 12X380 (Nail) ×2 IMPLANT
NEEDLE 22X1 1/2 (OR ONLY) (NEEDLE) IMPLANT
NS IRRIG 1000ML POUR BTL (IV SOLUTION) ×3 IMPLANT
PACK GENERAL/GYN (CUSTOM PROCEDURE TRAY) ×1 IMPLANT
PACK ORTHO EXTREMITY (CUSTOM PROCEDURE TRAY) ×3 IMPLANT
PAD ARMBOARD 7.5X6 YLW CONV (MISCELLANEOUS) ×6 IMPLANT
PADDING CAST ABS 6INX4YD NS (CAST SUPPLIES) ×4
PADDING CAST ABS COTTON 6X4 NS (CAST SUPPLIES) IMPLANT
PADDING CAST COTTON 6X4 STRL (CAST SUPPLIES) ×3 IMPLANT
PENCIL BUTTON HOLSTER BLD 10FT (ELECTRODE) ×2 IMPLANT
PIN GUIDE 3.2 903003004 (MISCELLANEOUS) ×2 IMPLANT
SCREW CORT TI DBL LEAD 5X30 (Screw) ×2 IMPLANT
SCREW CORT TI DBL LEAD 5X32 (Screw) ×2 IMPLANT
SCREW CORT TI DBL LEAD 5X60 (Screw) ×4 IMPLANT
SCREW CORT TI DBL LEAD 5X75 (Screw) ×2 IMPLANT
SCREW CORT TI DBL LEAD 5X80 (Screw) ×2 IMPLANT
SPONGE LAP 18X18 X RAY DECT (DISPOSABLE) ×3 IMPLANT
SPONGE SCRUB IODOPHOR (GAUZE/BANDAGES/DRESSINGS) ×3 IMPLANT
STAPLER VISISTAT 35W (STAPLE) ×3 IMPLANT
STOCKINETTE IMPERVIOUS LG (DRAPES) ×3 IMPLANT
STRIP CLOSURE SKIN 1/2X4 (GAUZE/BANDAGES/DRESSINGS) IMPLANT
SUCTION FRAZIER HANDLE 10FR (MISCELLANEOUS) ×2
SUCTION TUBE FRAZIER 10FR DISP (MISCELLANEOUS) IMPLANT
SUT ETHILON 3 0 PS 1 (SUTURE) ×1 IMPLANT
SUT PDS AB 2-0 CT1 27 (SUTURE) IMPLANT
SUT VIC AB 0 CT1 27 (SUTURE)
SUT VIC AB 0 CT1 27XBRD ANBCTR (SUTURE) ×1 IMPLANT
SUT VIC AB 1 CT1 27 (SUTURE)
SUT VIC AB 1 CT1 27XBRD ANBCTR (SUTURE) ×1 IMPLANT
SUT VIC AB 2-0 CT1 27 (SUTURE)
SUT VIC AB 2-0 CT1 TAPERPNT 27 (SUTURE) ×1 IMPLANT
SYR CONTROL 10ML LL (SYRINGE) IMPLANT
SYR TOOMEY 50ML (SYRINGE) ×2 IMPLANT
TOWEL OR 17X24 6PK STRL BLUE (TOWEL DISPOSABLE) ×6 IMPLANT
TOWEL OR 17X26 10 PK STRL BLUE (TOWEL DISPOSABLE) ×4 IMPLANT
TRAY FOLEY CATH SILVER 14FR (SET/KITS/TRAYS/PACK) ×4 IMPLANT
TUBE CONNECTING 12'X1/4 (SUCTIONS) ×1
TUBE CONNECTING 12X1/4 (SUCTIONS) ×2 IMPLANT
UNDERPAD 30X30 (UNDERPADS AND DIAPERS) ×3 IMPLANT
WATER STERILE IRR 1000ML POUR (IV SOLUTION) ×6 IMPLANT
YANKAUER SUCT BULB TIP NO VENT (SUCTIONS) ×3 IMPLANT

## 2016-08-17 NOTE — Progress Notes (Signed)
Interpreter Wyvonnia DuskyGraciela Namihira for [facility] RN Consent for surgery

## 2016-08-17 NOTE — Progress Notes (Signed)
Transport from OR at bedside. Spanish interpreter at bedside. Consent for surgery obtained. Dilaudid given for c/o pain. IVF saline locked. Patient sent to preop.

## 2016-08-17 NOTE — Progress Notes (Signed)
On assessment in PACU, patient's dressing saturated at hip.  Per Mellody DanceKeith, [place] dressing was changed and reinforced with adhesive dressing and ABD pads.  Ice applied.  Will continue to monitor.

## 2016-08-17 NOTE — Final Consult Note (Signed)
Consultant Final Sign-Off Note    Assessment/Final recommendations  Jay Anderson is a 61 y.o. male  While undergoing a femur fracture repair was found to have a difficultto catheterize urethra.  I was counseled that intraoperatively for Foley catheter placement.  A 14 French catheter was placed fairly atraumatically.   Remove the Foley catheter whenever deemed safe and appropriate by the primary team.  The patient does not need any additional medications.   Wound care (if applicable):    Diet at discharge: per primary team   Activity at discharge: per primary team   Follow-up appointment:  The patient can follow-up with me as needed.   Pending results:  [employer]     Ordered   08/24/16 0500  Creatinine, serum  (enoxaparin (LOVENOX)    CrCl >/= 30 ml/min)  Weekly,   R    Comments:  while on enoxaparin therapy   Question:  Specimen collection method  Answer:  Lab=Lab collect   08/17/16 1803   08/18/16 0500  Basic metabolic panel  Daily,   R    Question:  Specimen collection method  Answer:  Lab=Lab collect   08/17/16 1803   08/18/16 0000  CBC  Tomorrow morning,   R    Question:  Specimen collection method  Answer:  Lab=Lab collect   08/17/16 1803   08/17/16 1449  Urine culture  R    Comments:  Specimen A: Phone [phone] Immunocompromised?  No  Antibiotic Treatment:  none Is the patient on airborne/droplet precautions? No Clinical History:  N/A Special Instructions:  none Specimen Disposition:  Microbiology     08/17/16 1449       Medication recommendations:   Other recommendations:    Thank you for allowing [place] to participate in the care of your patient!  Please consult [place] again if you have further needs for your patient.  Crist Fat 08/17/2016 7:58 PM    Subjective     Objective  Vital signs in last 24 hours: Temp:  [97.6 F (36.4 C)-98.8 F (37.1 C)] 97.9 F (36.6 C) (03/06 1900) Pulse Rate:  [66-89] 69 (03/06 1900) Resp:   [11-18] 12 (03/06 1800) BP: (115-148)/(66-92) 124/74 (03/06 1900) SpO2:  [94 %-100 %] 98 % (03/06 1800)  General:    Pertinent labs and Studies:  Recent Labs  08/15/16 0742 08/16/16 0612 08/17/16 0431  WBC 9.5 6.8 5.4  HGB 14.0 13.3 13.3  HCT 41.5 40.3 40.6   BMET  Recent Labs  08/16/16 0612 08/17/16 0431  NA 139 142  K 3.7 4.1  CL 109 106  CO2 26 28  GLUCOSE 121* 117*  BUN 15 15  CREATININE 1.07 1.08  CALCIUM 8.7* 9.2   No results for input(s): LABURIN in the last 72 hours. Results for orders placed or performed during the hospital encounter of 08/15/16  Surgical pcr screen     Status: None   Collection Time: 08/16/16 12:09 AM  Result Value Ref Range Status   MRSA, PCR NEGATIVE NEGATIVE Final   Staphylococcus aureus NEGATIVE NEGATIVE Final    Comment:        The Xpert SA Assay (FDA approved for NASAL specimens in patients over 87 years of age), is one component of a comprehensive surveillance program.  Test performance has been validated by [facility] for patients greater than or equal to 86 year old. It is not intended to diagnose infection nor to guide or monitor treatment.   Surgical pcr  screen     Status: None   Collection Time: 08/17/16  5:03 AM  Result Value Ref Range Status   MRSA, PCR NEGATIVE NEGATIVE Final   Staphylococcus aureus NEGATIVE NEGATIVE Final    Comment:        The Xpert SA Assay (FDA approved for NASAL specimens in patients over 63 years of age), is one component of a comprehensive surveillance program.  Test performance has been validated by [facility] for patients greater than or equal to 63 year old. It is not intended to diagnose infection nor to guide or monitor treatment.     Imaging: Dg C-arm 61-120 Min  Result Date: 08/17/2016 CLINICAL DATA:  Right femur surgery EXAM: RIGHT FEMUR 2 VIEWS; DG C-ARM 61-120 MIN COMPARISON:  08/15/2016 FINDINGS: Three low resolution spot intraoperative views of the right femur.  Total fluoroscopy time 1 minutes 31 seconds. The images demonstrate intramedullary rod and multiple screw fixation of the right femur. Four screws fixate the acute distal femoral fracture. A lucent defect in the distal shaft of the femur corresponds to old hardware site. IMPRESSION: Intraoperative fluoroscopic assistance provided during right femur surgery Electronically Signed   By: Jasmine PangKim  Fujinaga M.D.   On: 08/17/2016 15:59   Dg Femur, Min 2 Views Right  Result Date: 08/17/2016 CLINICAL DATA:  Right femur surgery EXAM: RIGHT FEMUR 2 VIEWS; DG C-ARM 61-120 MIN COMPARISON:  08/15/2016 FINDINGS: Three low resolution spot intraoperative views of the right femur. Total fluoroscopy time 1 minutes 31 seconds. The images demonstrate intramedullary rod and multiple screw fixation of the right femur. Four screws fixate the acute distal femoral fracture. A lucent defect in the distal shaft of the femur corresponds to old hardware site. IMPRESSION: Intraoperative fluoroscopic assistance provided during right femur surgery Electronically Signed   By: Jasmine PangKim  Fujinaga M.D.   On: 08/17/2016 15:59   Dg Femur Port, Min 2 Views Right  Result Date: 08/17/2016 CLINICAL DATA:  61 y/o M; right femur fracture post intramedullary nail placement. EXAM: RIGHT FEMUR PORTABLE 2 VIEW COMPARISON:  None. FINDINGS: Mildly displaced distal femur diametaphyseal fracture fixed by an intramedullary rod and multiple transverse threaded screws. Fracture alignment and displacement is stable. Healed proximal femoral diaphyseal fracture. Degenerative changes of the knee joint partially visualized. IMPRESSION: Mildly displaced distal femur diametaphyseal fracture fixed by an intramedullary rod and multiple transverse threaded screws. Fracture alignment and displacement is stable. Electronically Signed   By: Mitzi HansenLance  Furusawa-Stratton M.D.   On: 08/17/2016 17:46

## 2016-08-17 NOTE — Anesthesia Postprocedure Evaluation (Signed)
Anesthesia Post Note  Patient: Jay Anderson  Procedure(s) Performed: Procedure(s) (LRB): HARDWARE REMOVAL RIGHT FEMUR (Right) INTRAMEDULLARY (IM) NAIL FEMORAL (Right)  Patient location during evaluation: PACU Anesthesia Type: General Level of consciousness: awake and alert Pain management: pain level controlled Vital Signs Assessment: post-procedure vital signs reviewed and stable Respiratory status: spontaneous breathing, nonlabored ventilation, respiratory function stable and patient connected to nasal cannula oxygen Cardiovascular status: blood pressure returned to baseline and stable Postop Assessment: no signs of nausea or vomiting Anesthetic complications: no       Last Vitals:  Vitals:   08/17/16 1650 08/17/16 1705  BP: 126/86 (!) 144/83  Pulse: 73 75  Resp: 13 11  Temp:      Last Pain:  Vitals:   08/17/16 1711  TempSrc:   PainSc: 6                  Edessa Jakubowicz,W. EDMOND

## 2016-08-17 NOTE — Brief Op Note (Signed)
08/15/2016 - 08/17/2016  4:49 PM  PATIENT:  Jay Anderson  61 y.o. male  PRE-OPERATIVE DIAGNOSIS:  RIGHT DISTAL FEMUR FRACTURE  POST-OPERATIVE DIAGNOSIS:   1. RIGHT DISTAL FEMUR FRACTURE 2. URETHRAL STRICTURE  PROCEDURE:  Procedure(s) with comments: 1. INTRAMEDULLARY (IM) NAIL FEMORAL (Right) - INTRAMEDULLARY (IM) NAIL FEMORAL WITH RETROGRADE BIOMET 12 X 380 MM STATICALLY LOCKED [place] NAIL 2. HARDWARE REMOVAL RIGHT FEMUR (Right) - HARDWARE REMOVAL RIGHT FEMUR 3. PLACEMENT OF URINARY CATHETER UNDER ANEASTHESIA  SURGEON:  Surgeon(s) and Role:    * Myrene GalasMichael Wednesday Ericsson, MD - Primary    * Crist FatBenjamin W [name], MD  PHYSICIAN ASSISTANT: Montez MoritaKEITH PAUL, [facility]  ANESTHESIA:   general  EBL:  Total I/O In: 2941.7 [I.V.:2941.7] Out: 300 [Urine:200; Blood:100]  BLOOD ADMINISTERED:none  DRAINS: none   LOCAL MEDICATIONS USED:  NONE  SPECIMEN:  No Specimen  DISPOSITION OF SPECIMEN:  N/A  COUNTS:  YES  TOURNIQUET:  * No tourniquets in log *  DICTATION: .Other Dictation: Dictation Number [id number]  PLAN OF CARE: Admit to inpatient   PATIENT DISPOSITION:  PACU - hemodynamically stable.   Delay start of Pharmacological VTE agent (>24hrs) due to surgical blood loss or risk of bleeding: no

## 2016-08-17 NOTE — Anesthesia Preprocedure Evaluation (Signed)
Anesthesia Evaluation  Patient identified by MRN, date of birth, ID band Patient awake    Reviewed: Allergy & Precautions, NPO status , Patient's Chart, lab work & pertinent test results  Airway Mallampati: II  TM Distance: >3 FB Neck ROM: Full    Dental no notable dental hx. (+) Teeth Intact   Pulmonary neg pulmonary ROS, former smoker,    Pulmonary exam normal breath sounds clear to auscultation       Cardiovascular negative cardio ROS Normal cardiovascular exam Rhythm:Regular Rate:Normal     Neuro/Psych negative neurological ROS  negative psych ROS   GI/Hepatic negative GI ROS, Neg liver ROS,   Endo/Other  negative endocrine ROS  Renal/GU negative Renal ROS  negative genitourinary   Musculoskeletal negative musculoskeletal ROS (+)   Abdominal   Peds negative pediatric ROS (+)  Hematology negative hematology ROS (+)   Anesthesia Other Findings   Reproductive/Obstetrics negative OB ROS                             Anesthesia Physical Anesthesia Plan  ASA: II  Anesthesia Plan: General   Post-op Pain Management:    Induction: Intravenous  Airway Management Planned: Oral ETT  Additional Equipment:   Intra-op Plan:   Post-operative Plan: Extubation in OR  Informed Consent: I have reviewed the patients History and Physical, chart, labs and discussed the procedure including the risks, benefits and alternatives for the proposed anesthesia with the patient or authorized representative who has indicated his/her understanding and acceptance.   Dental advisory given  Plan Discussed with: CRNA and Surgeon  Anesthesia Plan Comments:         Anesthesia Quick Evaluation

## 2016-08-17 NOTE — Transfer of Care (Signed)
Immediate Anesthesia Transfer of Care Note  Patient: Jay Anderson  Procedure(s) Performed: Procedure(s) with comments: HARDWARE REMOVAL RIGHT FEMUR (Right) - HARDWARE REMOVAL RIGHT FEMUR INTRAMEDULLARY (IM) NAIL FEMORAL (Right) - INTRAMEDULLARY (IM) NAIL FEMORAL  Patient Location: PACU  Anesthesia Type:General  Level of Consciousness: awake, alert  and oriented  Airway & Oxygen Therapy: Patient Spontanous Breathing and Patient connected to nasal cannula oxygen  Post-op Assessment: Report given to RN and Post -op Vital signs reviewed and stable  Post vital signs: Reviewed and stable  Last Vitals:  Vitals:   08/17/16 0618 08/17/16 1620  BP: 120/78 (!) 143/87  Pulse: 73 89  Resp: 16 11  Temp: 37.1 C 36.5 C    Last Pain:  Vitals:   08/17/16 1044  TempSrc:   PainSc: 7       Patients Stated Pain Goal: 0 (08/15/16 1615)  Complications: No apparent anesthesia complications

## 2016-08-17 NOTE — Progress Notes (Signed)
[facility]   Reviewed nsg note from yesterday evening Order placed at 1109 yesterday (08/17/2016) Order viewed in order history  Spoke with nurse this am to ensure consent completed  I discussed surgery, risks and benefits with pt and family yesterday   Mearl LatinKeith W. Benford Asch, PA-C [facility] [phone] (P) 08/17/2016 8:30 AM

## 2016-08-17 NOTE — Op Note (Signed)
Pre-procedure diagnosis: difficult foley catheterization Post-procedure diagnosis: as above  Procedure performed: placement of complicated foley  Surgeon: Dr. 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  Specimen: Urine culture sent  Drains: 47fsilicone-tipped catheter  Indications: I was contacted by Dr. HCharlean Merl for a difficult Foley.  Several of the OR staff were unsuccessful at placing the catheter.  Procedure:  Gentials were prepped and draped in the routine sterile fashion.  10cc of 1% viscous lidocaine jelly was then injected into the patient's urethra.  A 4 French silicone-tipped catheter was then gently passed in to the urethral.  Resistance was met at the the urethral meatus and penile urethra, but with gentle pressue the catheter slid through into the bladder.  Clear yellow urine was returned.  Patient tolerated the procedure well - no immediate issues.  Disposition: The catheter was placed atraumatically  and as such the Foley catheter can be removed whenever warranted and deemed appropriate by the primary team.

## 2016-08-17 NOTE — Progress Notes (Signed)
Patient returned from PACU, report concluded at bedside. Vital signs stable. Patient denies pain, but c/o nausea.

## 2016-08-18 ENCOUNTER — Encounter (HOSPITAL_COMMUNITY): Payer: Self-pay | Admitting: Orthopedic Surgery

## 2016-08-18 DIAGNOSIS — E559 Vitamin D deficiency, unspecified: Secondary | ICD-10-CM

## 2016-08-18 DIAGNOSIS — Z789 Other specified health status: Secondary | ICD-10-CM

## 2016-08-18 DIAGNOSIS — Z7289 Other problems related to lifestyle: Secondary | ICD-10-CM

## 2016-08-18 DIAGNOSIS — E119 Type 2 diabetes mellitus without complications: Secondary | ICD-10-CM

## 2016-08-18 HISTORY — DX: Vitamin D deficiency, unspecified: E55.9

## 2016-08-18 HISTORY — DX: Other specified health status: Z78.9

## 2016-08-18 HISTORY — DX: Type 2 diabetes mellitus without complications: E11.9

## 2016-08-18 HISTORY — DX: Other problems related to lifestyle: Z72.89

## 2016-08-18 LAB — BASIC METABOLIC PANEL
ANION GAP: 7 (ref 5–15)
BUN: 11 mg/dL (ref 6–20)
CHLORIDE: 103 mmol/L (ref 101–111)
CO2: 27 mmol/L (ref 22–32)
CREATININE: 0.96 mg/dL (ref 0.61–1.24)
Calcium: 8.4 mg/dL — ABNORMAL LOW (ref 8.9–10.3)
GFR calc Af Amer: >60 mL/min (ref >60)
GFR calc non Af Amer: >60 mL/min (ref >60)
GLUCOSE: 149 mg/dL — AB (ref 65–99)
Potassium: 4 mmol/L (ref 3.5–5.1)
Sodium: 137 mmol/L (ref 135–145)

## 2016-08-18 LAB — CBC
HEMATOCRIT: 35 % — AB (ref 39.0–52.0)
HEMOGLOBIN: 11.1 g/dL — AB (ref 13.0–17.0)
MCH: 28.8 pg (ref 26.0–34.0)
MCHC: 31.7 g/dL (ref 30.0–36.0)
MCV: 90.7 fL (ref 78.0–100.0)
Platelets: 166 10*3/uL (ref 150–400)
RBC: 3.86 MIL/uL — AB (ref 4.22–5.81)
RDW: 12.9 % (ref 11.5–15.5)
WBC: 7 10*3/uL (ref 4.0–10.5)

## 2016-08-18 LAB — GLUCOSE, CAPILLARY
GLUCOSE-CAPILLARY: 134 mg/dL — AB (ref 65–99)
GLUCOSE-CAPILLARY: 169 mg/dL — AB (ref 65–99)
GLUCOSE-CAPILLARY: 161 mg/dL — AB (ref 65–99)
Glucose-Capillary: 126 mg/dL — ABNORMAL HIGH (ref 65–99)

## 2016-08-18 MED ORDER — LIVING WELL WITH DIABETES BOOK - IN SPANISH
Freq: Once | Status: AC
  Administered 2016-08-18: 14:00:00
  Filled 2016-08-18: qty 1

## 2016-08-18 MED ORDER — GLUCERNA SHAKE PO LIQD
237.0000 mL | Freq: Two times a day (BID) | ORAL | Status: DC
  Administered 2016-08-18 – 2016-08-20 (×5): 237 mL via ORAL

## 2016-08-18 MED ORDER — VITAMIN D 1000 UNITS PO TABS
2000.0000 [IU] | ORAL_TABLET | Freq: Two times a day (BID) | ORAL | Status: DC
  Administered 2016-08-18 – 2016-08-20 (×5): 2000 [IU] via ORAL
  Filled 2016-08-18 (×5): qty 2

## 2016-08-18 NOTE — Progress Notes (Signed)
Interpreter Jay DuskyGraciela Anderson for Jay BraunKaren Diabetes educator

## 2016-08-18 NOTE — Evaluation (Signed)
Physical Therapy Evaluation Patient Details Name: Jay Anderson MRN: [id number] DOB: 1955/10/06 Today's Date: 08/18/2016   History of Present Illness  61 y.o. male admitted following a fall with resulting Rt distal femor fx. Pt now s/p IM nailing on 08/17/16. PMH: previous Rt femur ORIF.   Clinical Impression  Pt requiring mod assist with bed mobility and transfers at this time. Gait was performed using rw and min assist. (6 ft). Pt reports that he lives with his son who work during the day. The pt states that he would ask family and friends if someone would be able to stay with him during the day when his son is gone. The pt is appropriate for continued PT services to maximize mobility and safety.     Follow Up Recommendations Home health PT;Supervision for mobility/OOB    Equipment Recommendations  Rolling walker with 5" wheels (possible need for w/c depending on mobility progress. )    Recommendations for Other Services       Precautions / Restrictions Precautions Precautions: Fall Restrictions Weight Bearing Restrictions: Yes RLE Weight Bearing: Non weight bearing      Mobility  Bed Mobility Overal bed mobility: Needs Assistance Bed Mobility: Supine to Sit     Supine to sit: Mod assist     General bed mobility comments: assist provided with Rt LE and trunk, HOB elevated approx. 30 degrees.  Transfers Overall transfer level: Needs assistance Equipment used: Rolling walker (2 wheeled) Transfers: Sit to/from Stand Sit to Stand: Mod assist         General transfer comment: cues for hand placement needed, reinforcing NWB.   Ambulation/Gait Ambulation/Gait assistance: Min assist Ambulation Distance (Feet): 6 Feet Assistive device: Rolling walker (2 wheeled) Gait Pattern/deviations:  (modified swing-to pattern. ) Gait velocity: slow pattern   General Gait Details: Pt having difficulty maintaining weight through UEs, having to slide LLE forward. Mild improvement  with repetitions.   Stairs            Wheelchair Mobility    Modified Rankin (Stroke Patients Only)       Balance Overall balance assessment: Needs assistance Sitting-balance support: Single extremity supported Sitting balance-Leahy Scale: Fair     Standing balance support: Bilateral upper extremity supported Standing balance-Leahy Scale: Poor Standing balance comment: using rw for support                             Pertinent Vitals/Pain Pain Assessment: Faces Faces Pain Scale: Hurts little more Pain Location: Lt knee/leg Pain Descriptors / Indicators: Sharp;Aching;Grimacing;Guarding Pain Intervention(s): Limited activity within patient's tolerance;Monitored during session    Home Living Family/patient expects to be discharged to:: Private residence Living Arrangements: Children (son) Available Help at Discharge: Family;Available PRN/intermittently Type of Home: Apartment Home Access: Stairs to enter Entrance Stairs-Rails: Left Entrance Stairs-Number of Steps: 3 Home Layout: One level Home Equipment: Gilmer Mor - single point Additional Comments: Lives with son who works in [place]. Pt is home alone during the day. Pt states that he is trying to get someone to stay with him while his son is gone.     Prior Function Level of Independence: Independent (occasional use of SPC)               Hand Dominance        Extremity/Trunk Assessment   Upper Extremity Assessment Upper Extremity Assessment: Overall WFL for tasks assessed    Lower Extremity Assessment Lower Extremity Assessment: RLE deficits/detail RLE  Deficits / Details: assistance needed to move Rt LE for bed mobility       Communication   Communication: Prefers language other than [name];Interpreter utilized (Spanish speaker, interpreter [phone] used. )  Cognition Arousal/Alertness: Awake/alert Behavior During Therapy: WFL for tasks assessed/performed Overall Cognitive Status:  Within Functional Limits for tasks assessed                      General Comments      Exercises     Assessment/Plan    PT Assessment Patient needs continued PT services  PT Problem List Decreased strength;Decreased range of motion;Decreased activity tolerance;Decreased balance;Decreased mobility;Decreased knowledge of use of DME       PT Treatment Interventions DME instruction;Gait training;Stair training;Functional mobility training;Therapeutic activities;Therapeutic exercise;Patient/family education    PT Goals (Current goals can be found in the Care Plan section)  Acute Rehab PT Goals Patient Stated Goal: walk PT Goal Formulation: With patient Time For Goal Achievement: 09/01/16 Potential to Achieve Goals: Good    Frequency Min 3X/week   Barriers to discharge        Co-evaluation               End of Session Equipment Utilized During Treatment: Gait belt Activity Tolerance: Patient tolerated treatment well Patient left: in chair;with call bell/phone within reach Nurse Communication: Mobility status;Weight bearing status PT Visit Diagnosis: Difficulty in walking, not elsewhere classified (R26.2);Pain Pain - Right/Left: Right Pain - part of body: Leg         Time: [id number] PT Time Calculation (min) (ACUTE ONLY): 38 min   Charges:   PT Evaluation $PT Eval Moderate Complexity: 1 Procedure PT Treatments $Gait Training: 8-22 mins $Therapeutic Activity: 8-22 mins   PT G Codes:         Christiane HaBenjamin J. Tallin Hart, PT, CSCS Pager [phone] Office [phone]  08/18/2016, 9:34 AM

## 2016-08-18 NOTE — Progress Notes (Signed)
Orthopedic Trauma Service Progress Note    Subjective:  Patient reports pain as mild.    Doing ok No acute issues + language barrier  New dx of diabetes on this admission   hgb a1c 7.6% + Vitamin d deficiency as well   ROS  + pain R leg   Objective:   VITALS:   Vitals:   08/17/16 2200 08/17/16 2248 08/18/16 0010 08/18/16 0542  BP: (!) 151/87 (!) 157/87 115/75 112/72  Pulse: 81 79 82 83  Resp:   18 16  Temp:  97.7 F (36.5 C) 98.4 F (36.9 C) 99.1 F (37.3 C)  TempSrc:  Oral Oral Oral  SpO2: 97% 100% 99% 98%  Weight:      Height:        Intake/Output      03/06 0701 - 03/07 0700 03/07 0701 - 03/08 0700   P.O.     I.V. (mL/kg) 3528.3 (43.2)    IV Piggyback 100    Total Intake(mL/kg) 3628.3 (44.5)    Urine (mL/kg/hr) 1350 (0.7)    Blood 100 (0.1)    Total Output 1450     Net +2178.3            LABS  Results for orders placed or performed during the hospital encounter of 08/15/16 (from the past 24 hour(s))  Glucose, capillary     Status: Abnormal   Collection Time: 08/17/16  6:30 PM  Result Value Ref Range   Glucose-Capillary 167 (H) 65 - 99 mg/dL  Glucose, capillary     Status: Abnormal   Collection Time: 08/18/16  6:34 AM  Result Value Ref Range   Glucose-Capillary 134 (H) 65 - 99 mg/dL  Basic metabolic panel     Status: Abnormal   Collection Time: 08/18/16  7:43 AM  Result Value Ref Range   Sodium 137 135 - 145 mmol/L   Potassium 4.0 3.5 - 5.1 mmol/L   Chloride 103 101 - 111 mmol/L   CO2 27 22 - 32 mmol/L   Glucose, Bld 149 (H) 65 - 99 mg/dL   BUN 11 6 - 20 mg/dL   Creatinine, Ser 6.57 0.61 - 1.24 mg/dL   Calcium 8.4 (L) 8.9 - 10.3 mg/dL   GFR calc non Af Amer >60 >60 mL/min   GFR calc Af Amer >60 >60 mL/min   Anion gap 7 5 - 15  CBC     Status: Abnormal   Collection Time: 08/18/16  7:43 AM  Result Value Ref Range   WBC 7.0 4.0 - 10.5 K/uL   RBC 3.86 (L) 4.22 - 5.81 MIL/uL   Hemoglobin 11.1 (L) 13.0 - 17.0 g/dL   HCT 84.6 (L) 96.2 - 95.2 %   MCV 90.7 78.0 - 100.0 fL   MCH 28.8 26.0 - 34.0 pg   MCHC 31.7 30.0 - 36.0 g/dL   RDW 84.1 32.4 - 40.1 %   Platelets 166 150 - 400 K/uL   Results for Jay, Anderson (MRN [id number]) as of 08/18/2016 10:20  Ref. Range 08/16/2016 12:47  Vitamin D, 25-Hydroxy Latest Ref Range: 30.0 - 100.0 ng/mL 13.8 (L)   Results for Jay, Anderson (MRN [id number]) as of 08/18/2016 10:20  Ref. Range 08/16/2016 12:47  Hemoglobin A1C Latest Ref Range: 4.8 - 5.6 % 7.5 (H)   CBG (last 3)   Recent Labs  08/17/16 1830 08/18/16 0634  GLUCAP 167* 134*      PHYSICAL EXAM:   Gen: resting comfortably in bed, NAD Lungs: clear B  Cardiac: RRR, s1 and s2 Abd: + BS, NT Ext:       Right Lower Extremity   Dressing R leg stable  Hip dressing reinforced   Distal motor and sensory functions intact  Swelling controlled distally   No DCT, compartments are soft  Knee resting in flexion with pillow under knee   Assessment/Plan: 1 Day Post-Op   Principal Problem:   Closed fracture of distal end of femur (HCC) Active Problems:   Tobacco abuse, in remission   Polysubstance abuse   Anti-infectives    Start     Dose/Rate Route Frequency Ordered Stop   08/17/16 1830  ceFAZolin (ANCEF) IVPB 1 g/50 mL premix     1 g 100 mL/hr over 30 Minutes Intravenous Every 6 hours 08/17/16 1803 08/18/16 0601   08/17/16 1100  ceFAZolin (ANCEF) IVPB 2g/100 mL premix     2 g 200 mL/hr over 30 Minutes Intravenous  Once 08/16/16 1109 08/17/16 1237    .  POD/HD#: 1  61 y/o Hispanic male with R distal femur fx, peri-implant    - fall   - R distal femur fracture, peri-implant (antegrade femoral nail) s/p ROH and retrograde IMN R distal femur fracture            NWB x 6-8 weeks  Unrestricted ROM R knee  No pillows under knee at rest. Elevate leg by placing pillows under ankle  Ice and elevate  PT/OT  Dressing changes tomorrow   Reinforce as needed       - Pain management:              continue current regimen                          Dilaudid 1-2 mg IV q3h prn severe pain unrelenting pain (to be used only after all PO meds have been used)                         Percocet 5/325 1-2 po q6h prn pain                         Robaxin [phone] mg po q6h prn spasms                          - ABL anemia/Hemodynamics             Stable             Cbc in am    - Medical issues                            + cocaine on UDS                         Hepatitis and HIV negative     EtOH use               CIWA   Diabetes   New Dx    Will get DM coordinator to eval    [facility] as [facility] has no [facility]   Currently on SSI    Will likely start metformin tomorrow and dc on that    Vitamin D deficiency    Supplement    Urethral norrowing  S/p foley placement by urology     - DVT/PE prophylaxis:             Lovenox x 21 days  Will likely need medication assistance  - ID:              periop abx      - Activity:             OOB as tolerated with assistance  NWB R leg  Unrestricted ROM R knee   - FEN/GI prophylaxis/Foley/Lines:            continue IVF  CHO mod diet   Will plan for foley dc tomorrow, once pt mobilizes some more given difficulty with insertion               - Dispo:             PT/OT  Possible dc home tomorrow but more likely Friday     Mearl Latin, PA-C [facility] [phone] (O) 08/18/2016, 10:26 AM

## 2016-08-18 NOTE — Progress Notes (Signed)
Nutrition Follow-up  DOCUMENTATION CODES:   Not applicable  INTERVENTION:  Provide Glucerna Shake po BID, each supplement provides 220 kcal and 10 grams of protein.  Diet education regarding diabetes given.   NUTRITION DIAGNOSIS:   Increased nutrient needs related to  (surgery) as evidenced by estimated needs; ongoing  GOAL:   Patient will meet greater than or equal to 90% of their needs; met  MONITOR:   PO intake, Supplement acceptance, Labs, Weight trends, Skin, I & O's  REASON FOR ASSESSMENT:   Malnutrition Screening Tool    ASSESSMENT:   61 year old male pt ETOH/drug abuse, with a past med hx of a intramedullary nail placed in 1986.  Pt does not speak English but seems to understand some.  They report a trama in the middle of the night.  He fell and now has significant pain of his right knee.  Imaging confirms fracture below nail fixation.   Procedure (3/6): HARDWARE REMOVAL RIGHT FEMUR (Right) INTRAMEDULLARY (IM) NAIL FEMORAL (Right)  Meal completion 100% this AM. Pt reports having a good appetite and reports no difficulties with his food at meals. RD additionally consulted for diet education regarding new diabetes diagnosis. Pt was given handout "Carbohydrate Counting for people with diabetes" handout in Spanish from the Academy of Nutrition and Diabetes Manual. List of carbohydrate containing foods and appropriate serving sizes were given. Pt was able to read and review the packet and report no further questions. Discussed monitoring carbohydrates at meals to manage diabetes. Noted pt currently has Ensure ordered. RD to modify orders and order Glucerna Shake instead.   Labs and medications reviewed. CBGs 134-169 mg/dL.   Diet Order:  Diet Carb Modified Fluid consistency: Thin; Room service appropriate? Yes  Skin:   (Incision R leg)  Last BM:  3/4  Height:   Ht Readings from Last 1 Encounters:  08/15/16 5' 6"  (1.676 m)    Weight:   Wt Readings from Last 1  Encounters:  08/15/16 180 lb (81.6 kg)    Ideal Body Weight:  64.5 kg  BMI:  Body mass index is 29.05 kg/m.  Estimated Nutritional Needs:   Kcal:  1950-2150  Protein:  95-105 grams  Fluid:  1.9 - 2.1 L/day  EDUCATION NEEDS:   No education needs identified at this time  Corrin Parker, MS, RD, LDN Pager # [phone] After hours/ weekend pager # [phone]

## 2016-08-18 NOTE — Evaluation (Signed)
Occupational Therapy Evaluation Patient Details Name: Jay Anderson MRN: [id number] DOB: 05/17/1956 Today's Date: 08/18/2016    History of Present Illness 61 y.o. male admitted following a fall with resulting Rt distal femor fx. Pt now s/p IM nailing on 08/17/16. PMH: previous Rt femur ORIF.    Clinical Impression   PTA Pt independent in ADL and mod I in mobility (SPC PRN). Pt currently mod A for LB ADL and min A for mobility with RW. Please see performance level below.  Pt currently unable to maintain NWB despite mod verbal cues with translator. Pt maintaining TDWB. Pt will benefit from skilled OT in the acute setting to maximize safety and independence in ADL and functional transfers. Next session to focus on maintaining weight bearing status, AE education and tub transfer.    Follow Up Recommendations  Other (comment);Supervision/Assistance - 24 hour (OT reccomends 24 hour care, not sure if Pt can provide)    Equipment Recommendations  3 in 1 bedside commode;Tub/shower bench    Recommendations for Other Services       Precautions / Restrictions Precautions Precautions: Fall Restrictions Weight Bearing Restrictions: Yes RLE Weight Bearing: Non weight bearing      Mobility Bed Mobility Overal bed mobility: Needs Assistance Bed Mobility: Supine to Sit     Supine to sit: Min assist     General bed mobility comments: min A for assist with operated leg  Transfers Overall transfer level: Needs assistance Equipment used: Rolling walker (2 wheeled) Transfers: Sit to/from Stand Sit to Stand: Min assist         General transfer comment: vc for hand placement and NWB    Balance Overall balance assessment: Needs assistance Sitting-balance support: No upper extremity supported;Feet supported Sitting balance-Leahy Scale: Fair     Standing balance support: Bilateral upper extremity supported Standing balance-Leahy Scale: Poor Standing balance comment: using rw for  support                            ADL Overall ADL's : Needs assistance/impaired Eating/Feeding: Independent Eating/Feeding Details (indicate cue type and reason): Pt eating dinner at end of session Grooming: Wash/dry hands;Minimal assistance;Standing Grooming Details (indicate cue type and reason): sink level Upper Body Bathing: Set up   Lower Body Bathing: Moderate assistance   Upper Body Dressing : Modified independent   Lower Body Dressing: Moderate assistance;Sit to/from stand Lower Body Dressing Details (indicate cue type and reason): edcuated to dress the operated leg first Toilet Transfer: Minimal assistance;Cueing for sequencing;Comfort height toilet;RW Toilet Transfer Details (indicate cue type and reason): Pt received mod vc to maintain NWB, Pt able to maintain TDWB         Functional mobility during ADLs: Minimal assistance;Rolling walker;Cueing for safety (unable to maintain NWB despite mod vc with translator)       Vision Baseline Vision/History: Wears glasses Wears Glasses: Reading only Patient Visual Report: No change from baseline Vision Assessment?: No apparent visual deficits     Perception     Praxis      Pertinent Vitals/Pain Pain Assessment: 0-10 Pain Score: 7  Faces Pain Scale: Hurts little more Pain Location: Lt knee/leg Pain Descriptors / Indicators: Grimacing;Guarding Pain Intervention(s): Limited activity within patient's tolerance;Monitored during session;Repositioned     Hand Dominance Left   Extremity/Trunk Assessment Upper Extremity Assessment Upper Extremity Assessment: Overall WFL for tasks assessed   Lower Extremity Assessment Lower Extremity Assessment: RLE deficits/detail RLE Deficits / Details: assistance needed to  move Rt LE for bed mobility   Cervical / Trunk Assessment Cervical / Trunk Assessment: Normal   Communication Communication Communication: Prefers language other than English;Interpreter utilized  [employer] [place] ID# [id number])   Cognition Arousal/Alertness: Awake/alert Behavior During Therapy: WFL for tasks assessed/performed Overall Cognitive Status: Within Functional Limits for tasks assessed                     General Comments       Exercises      Shoulder Instructions      Home Living Family/patient expects to be discharged to:: Private residence Living Arrangements: Children (son adult) Available Help at Discharge: Family;Available PRN/intermittently Type of Home: Apartment Home Access: Stairs to enter Entrance Stairs-Number of Steps: 3 Entrance Stairs-Rails: Left Home Layout: One level     Bathroom Shower/Tub: Tub/shower unit Shower/tub characteristics: Curtain FirefighterBathroom Toilet: Handicapped height Bathroom Accessibility: Yes How Accessible: Accessible via walker Home Equipment: Cane - single point;Grab bars - toilet   Additional Comments: Lives with son who works in [place]. Pt is home alone during the day. Pt states that he is trying to get someone to stay with him while his son is gone.       Prior Functioning/Environment Level of Independence: Independent (used SPC PRN)                 OT Problem List: Decreased strength;Decreased range of motion;Decreased activity tolerance;Impaired balance (sitting and/or standing);Decreased knowledge of use of DME or AE;Decreased knowledge of precautions;Pain      OT Treatment/Interventions: Self-care/ADL training;Therapeutic activities;Patient/family education;Balance training;DME and/or AE instruction    OT Goals(Current goals can be found in the care plan section) Acute Rehab OT Goals Patient Stated Goal: to get better OT Goal Formulation: With patient Time For Goal Achievement: 09/01/16 Potential to Achieve Goals: Good ADL Goals Pt Will Perform Lower Body Bathing: with min assist;with caregiver independent in assisting;with adaptive equipment;sitting/lateral leans Pt Will Perform  Lower Body Dressing: with min assist;with caregiver independent in assisting;with adaptive equipment;sit to/from stand Pt Will Transfer to Toilet: with supervision;ambulating;bedside commode (with RW) Pt Will Perform Toileting - Clothing Manipulation and hygiene: with supervision;sit to/from stand Pt Will Perform Tub/Shower Transfer: Tub transfer;with min guard assist;with caregiver independent in assisting;tub bench;3 in 1;rolling walker;ambulating Additional ADL Goal #1: Pt will maintain weightbearing precautions during session with only 2 verbal cues  OT Frequency: Min 3X/week   Barriers to D/C: Decreased caregiver support  Pt will be by himself during the day       Co-evaluation              End of Session Equipment Utilized During Treatment: Gait belt;Rolling walker Nurse Communication: Mobility status;Weight bearing status;Precautions  Activity Tolerance: Patient tolerated treatment well Patient left: in bed;with call bell/phone within reach  OT Visit Diagnosis: Unsteadiness on feet (R26.81);Other abnormalities of gait and mobility (R26.89);History of falling (Z91.81)                ADL either performed or assessed with clinical judgement  Time: [id number] OT Time Calculation (min): 31 min Charges:  OT General Charges $OT Visit: 1 Procedure OT Evaluation $OT Eval Moderate Complexity: 1 Procedure OT Treatments $Self Care/Home Management : 8-22 mins G-Codes:     Sherryl MangesLaura Darnesha Diloreto OTR/L [phone] Evern BioLaura J Breea Loncar 08/18/2016, 5:08 PM

## 2016-08-18 NOTE — Progress Notes (Addendum)
Consult for new diabetes diagnosis, teaching started at bedside with Spanish Interpreter.  AACE/ADA: New Consensus Statement on Inpatient Glycemic Control (2015)  Target Ranges:  Prepandial:   less than 140 mg/dL      Peak postprandial:   less than 180 mg/dL (1-2 hours)      Critically ill patients:  140 - 180 mg/dL   Lab Results  Component Value Date   GLUCAP 169 (H) 08/18/2016   HGBA1C 7.5 (H) 08/16/2016   Spoke with patient about new diabetes diagnosis.    Education needs addressed: 1.  What an A1C and a CBG are and why important. 2.  Basic pathophysiology of DM Type 2 and basic home care 3.  Goals for A1C and CBG's (when to check, how to record) 4.  Impact of nutrition, exercise, stress, sickness, and medications on diabetes control.  5.  How to perform a CBG 6.  Ordered Living Well with diabetes booklet and encouraged patient to read 7.  Handout information on diet and [employer] products for testing supplies 8.  Consult to Care Management and Registered Dietician  Patient verbalized understanding of information discussed and he states that he has no further questions at this time related to diabetes.   RNs to provide ongoing basic DM education at bedside with this patient and engage patient to actively check blood glucose.   Thank you,  Kristine LineaKaren Liam Cammarata, RN, BSN Diabetes Coordinator Inpatient Diabetes Program [phone] (Team Pager) [phone] [facility] office)

## 2016-08-18 NOTE — Op Note (Signed)
[name], [name] NO.:  [id number]  MEDICAL RECORD NO.:  [id number]  LOCATION:                                 FACILITY:  PHYSICIAN:  Jay Albino. Carola Anderson, M.D.      DATE OF BIRTH:  DATE OF PROCEDURE:  08/17/2016 DATE OF DISCHARGE:                              OPERATIVE REPORT   PREOPERATIVE DIAGNOSIS:  Right distal femur fracture, periimplant.  POSTOPERATIVE DIAGNOSES: 1. Right distal femur fracture, periimplant. 2. Urethral stricture.  PROCEDURES: 1. Retrograde IM nailing of the right femur using a Biomet 12 x 380 mm     statically locked [place] nail. 2. Hardware removal, right femur including screws and intramedullary     nail. 3. Placement of urinary catheter under anesthesia by Dr. Cordella Anderson.  SURGEON: 1. Jay Albino. Carola Anderson, M.D. 2. (For placement of the catheter) Jay Anderson, M.D.  ASSISTANT:  Jay Anderson, Jay Anderson.  ANESTHESIA:  General.  COMPLICATIONS:  None.  I/O:  IV fluids greater than 2000 mL and out, urine 200 mL.  ESTIMATED BLOOD LOSS:  100 mL.  SPECIMENS:  Urine for culture.  DISPOSITION:  To PACU.  CONDITION:  Stable.  BRIEF SUMMARY AND INDICATION FOR PROCEDURE:  Jay Anderson is a 61- year-old male, who fell sustaining a fracture of his distal femur around an intramedullary nail that had been placed back in 1986.  We discussed with the patient risks and benefits of surgical repair including the possibility of failure to remove the nail and possibility of needing to remove it later in the event he goes on to symptomatic end-stage hip arthritis as he already has some, radiographic changes, potential for heart attack, stroke, infection, nerve injury, vessel injury, loss of motion, symptomatic hardware, and others.  He did wish to proceed.  DESCRIPTION OF PROCEDURE:  The patient was taken to the OR after administration of preoperative antibiotics.  We were unable to pass a catheter despite using sequentially smaller  catheters and a Coude. Urology was paged at that point.  We then proceeded with prepping and positioning.  The patient was under general anesthesia.  The C-arm was brought in and the locking bolts identified.  Incision was made and then the locking bolts withdrawn.  The patient's leg was brought into abduction and the posterior incision used to place the nail was remade. Dissection was carried carefully down to the top of the fluted nail. The locking bolt in this area was completely underneath the bone and overlying bone had to be removed in order to visualize the head and extract it.  The nail itself was then engaged with the threaded extraction device and withdrawn.  Following this, wounds were irrigated thoroughly.  At that time, Urology arrived and Dr. Marlou Anderson were able to place a catheter.  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  Attention was then turned to repairing the femur fracture.  A radiolucent triangle and bumps were used to reduce the fracture.  We were very careful to try to produce a reduction and proper alignment of the femur and my assistant Jay Anderson pulled traction and was vigilant to maintain alignment throughout.  A 2 cm incision was made at  the midline of the knee.  Medial parapatellar retinacular incision made. The threaded guidewire advanced in the center position of the distal femur just at the apex of [place]um[place]at line.  Starting reamer was used and then sequential reaming up to 13.5 mm.  We then placed a 12.5 nail and used all of the distal locking screws and 2 anterior to posterior bolts proximally.  Final images showed appropriate reduction.  We did engage the threaded device distally in order to convert the distal locking bolts into fixed angle construct.  All wounds were irrigated thoroughly and then closed in standard layered fashion.  There were no complications during the procedure.  Jay Anderson, Jay Anderson assisted  me throughout and his assistance was absolutely necessary to control the femur, both for extraction of the nail as well as maintaining and producing reduction for fixation of the fracture.  PROGNOSIS:  The patient will be nonweightbearing on the right lower extremity with unrestricted range of motion of the knee.  We anticipate advancing weightbearing at 6 weeks.  He will be on formal pharmacologic DVT prophylaxis and he will require referral for treatment of his diabetes.  Followup for Urology will be with Dr. Marlou PorchHerrick.     Jay AlbinoMichael H. Carola FrostHandy, M.D.   ______________________________ Jay AlbinoMichael H. Carola FrostHandy, M.D.    MHH/MEDQ  D:  08/17/2016  T:  08/17/2016  Job:  [id number]

## 2016-08-18 NOTE — Progress Notes (Signed)
Physical Therapy Treatment Patient Details Name: Jay Anderson MRN: [id number] DOB: April 08, 1956 Today's Date: 08/18/2016    History of Present Illness 61 y.o. male admitted following a fall with resulting Rt distal femor fx. Pt now s/p IM nailing on 08/17/16. PMH: previous Rt femur ORIF.     PT Comments    Pt able to progress with his ambulation but distance remains limited. Reminding pt to maintain NWB through Rt LE throughout session. PT to continue to progress as tolerated with focus on ambulation. No clarification on availability of additional home support yet.    Follow Up Recommendations  Home health PT;Supervision for mobility/OOB     Equipment Recommendations  Rolling walker with 5" wheels (possible need for w/c depending on mobility progress. )    Recommendations for Other Services       Precautions / Restrictions Precautions Precautions: Fall Restrictions Weight Bearing Restrictions: Yes RLE Weight Bearing: Non weight bearing    Mobility  Bed Mobility Overal bed mobility: Needs Assistance Bed Mobility: Supine to Sit     Supine to sit: Supervision     General bed mobility comments: HOB elevated, slow transition  Transfers Overall transfer level: Needs assistance Equipment used: Rolling walker (2 wheeled) Transfers: Sit to/from Stand Sit to Stand: Min assist         General transfer comment: good hand placement, reminder for NWB  Ambulation/Gait Ambulation/Gait assistance: Min guard Ambulation Distance (Feet): 15 Feet Assistive device: Rolling walker (2 wheeled) Gait Pattern/deviations:  (modified swing-to pattern. ) Gait velocity: slow pattern   General Gait Details: Reminder for NWB, pt wanting to place foot on floor. Improved steps with practice. Encouraging use of UEs.    Stairs            Wheelchair Mobility    Modified Rankin (Stroke Patients Only)       Balance                                    Cognition  Arousal/Alertness: Awake/alert Behavior During Therapy: WFL for tasks assessed/performed Overall Cognitive Status: Within Functional Limits for tasks assessed                      Exercises General Exercises - Lower Extremity Heel Slides: AAROM;Right;10 reps    General Comments        Pertinent Vitals/Pain Pain Assessment: Faces Faces Pain Scale: Hurts little more Pain Location: Lt knee/leg Pain Descriptors / Indicators: Grimacing;Guarding Pain Intervention(s): Limited activity within patient's tolerance    Home Living                      Prior Function            PT Goals (current goals can now be found in the care plan section) Acute Rehab PT Goals PT Goal Formulation: With patient Time For Goal Achievement: 09/01/16 Potential to Achieve Goals: Good Progress towards PT goals: Progressing toward goals    Frequency    Min 3X/week      PT Plan Current plan remains appropriate    Co-evaluation             End of Session Equipment Utilized During Treatment: Gait belt Activity Tolerance: Patient tolerated treatment well Patient left: in chair;with call bell/phone within reach (in knee extension) Nurse Communication: Mobility status;Weight bearing status PT Visit Diagnosis: Difficulty in walking, not elsewhere classified (R26.2);Pain  Pain - Right/Left: Right Pain - part of body: Leg     Time: 1414-1430 PT Time Calculation (min) (ACUTE ONLY): 16 min  Charges:  $Gait Training: 8-22 mins                    G Codes:       Christiane HaBenjamin J. Aldin Drees, PT, CSCS Pager [phone] Office [phone]  08/18/2016, 3:32 PM

## 2016-08-19 LAB — CBC
HEMATOCRIT: 32.9 % — AB (ref 39.0–52.0)
HEMOGLOBIN: 10.6 g/dL — AB (ref 13.0–17.0)
MCH: 29 pg (ref 26.0–34.0)
MCHC: 32.2 g/dL (ref 30.0–36.0)
MCV: 90.1 fL (ref 78.0–100.0)
Platelets: 165 10*3/uL (ref 150–400)
RBC: 3.65 MIL/uL — ABNORMAL LOW (ref 4.22–5.81)
RDW: 12.9 % (ref 11.5–15.5)
WBC: 6.9 10*3/uL (ref 4.0–10.5)

## 2016-08-19 LAB — COMPREHENSIVE METABOLIC PANEL
ALBUMIN: 2.7 g/dL — AB (ref 3.5–5.0)
ALK PHOS: 72 U/L (ref 38–126)
ALT: 22 U/L (ref 17–63)
AST: 26 U/L (ref 15–41)
Anion gap: 7 (ref 5–15)
BUN: 12 mg/dL (ref 6–20)
CALCIUM: 8.4 mg/dL — AB (ref 8.9–10.3)
CO2: 28 mmol/L (ref 22–32)
CREATININE: 0.94 mg/dL (ref 0.61–1.24)
Chloride: 103 mmol/L (ref 101–111)
GFR calc Af Amer: >60 mL/min (ref >60)
GFR calc non Af Amer: >60 mL/min (ref >60)
GLUCOSE: 124 mg/dL — AB (ref 65–99)
Potassium: 3.7 mmol/L (ref 3.5–5.1)
SODIUM: 138 mmol/L (ref 135–145)
Total Bilirubin: 0.7 mg/dL (ref 0.3–1.2)
Total Protein: 6.5 g/dL (ref 6.5–8.1)

## 2016-08-19 LAB — TSH: TSH: 1.421 u[IU]/mL (ref 0.350–4.500)

## 2016-08-19 LAB — GLUCOSE, CAPILLARY
Glucose-Capillary: 155 mg/dL — ABNORMAL HIGH (ref 65–99)
Glucose-Capillary: 204 mg/dL — ABNORMAL HIGH (ref 65–99)
Glucose-Capillary: 170 mg/dL — ABNORMAL HIGH (ref 65–99)
Glucose-Capillary: 114 mg/dL — ABNORMAL HIGH (ref 65–99)

## 2016-08-19 LAB — PREALBUMIN: Prealbumin: 11.9 mg/dL — ABNORMAL LOW (ref 18–38)

## 2016-08-19 LAB — MAGNESIUM: Magnesium: 2 mg/dL (ref 1.7–2.4)

## 2016-08-19 LAB — PHOSPHORUS: Phosphorus: 2.5 mg/dL (ref 2.5–4.6)

## 2016-08-19 MED ORDER — METFORMIN HCL 500 MG PO TABS
500.0000 mg | ORAL_TABLET | Freq: Every day | ORAL | Status: DC
  Administered 2016-08-20: 500 mg via ORAL
  Filled 2016-08-19: qty 1

## 2016-08-19 MED ORDER — OXYCODONE HCL 5 MG PO TABS
5.0000 mg | ORAL_TABLET | Freq: Four times a day (QID) | ORAL | Status: DC | PRN
  Administered 2016-08-20 (×2): 10 mg via ORAL
  Filled 2016-08-19 (×2): qty 2

## 2016-08-19 NOTE — Progress Notes (Signed)
Inpatient Diabetes Program Recommendations  AACE/ADA: New Consensus Statement on Inpatient Glycemic Control (2015)  Target Ranges:  Prepandial:   less than 140 mg/dL      Peak postprandial:   less than 180 mg/dL (1-2 hours)      Critically ill patients:  140 - 180 mg/dL   Lab Results  Component Value Date   GLUCAP 155 (H) 08/19/2016   HGBA1C 7.5 (H) 08/16/2016    Review of Glycemic Control  Diabetes history: New diagnosis of DM Outpatient Diabetes medications: none Current orders for Inpatient glycemic control: Novolog 0-15 units TID [facility]  Inpatient Diabetes Program Recommendations:   Oral Agents: At time of discharge, please consider ordering Metformin 500 mg daily.  Thank you,  Kristine LineaKaren Kecia Swoboda, RN, MSN Diabetes Coordinator Inpatient Diabetes Program [phone] (Team Pager)

## 2016-08-19 NOTE — Progress Notes (Signed)
Orthopedic Trauma Service Progress Note    Subjective:  Patient reports pain as moderate.    Appears to be doing well Mobilizing back to bed with nurse  PT/OT notes reviewed    ROS As above   Objective:   VITALS:   Vitals:   08/18/16 0542 08/18/16 1500 08/18/16 1947 08/19/16 0600  BP: 112/72 (!) 141/82 135/83 128/87  Pulse: 83 88 (!) 117 100  Resp: 16 16 16 16   Temp: 99.1 F (37.3 C) 99.6 F (37.6 C) 99.4 F (37.4 C) 99.1 F (37.3 C)  TempSrc: Oral Oral Oral Oral  SpO2: 98% 93% 94% 95%  Weight:      Height:        Intake/Output      03/07 0701 - 03/08 0700 03/08 0701 - 03/09 0700   P.O. 480 240   I.V. (mL/kg)     IV Piggyback     Total Intake(mL/kg) 480 (5.9) 240 (2.9)   Urine (mL/kg/hr) 2050 (1) 450 (0.9)   Blood     Total Output 2050 450   Net -1570 -210          LABS  Results for orders placed or performed during the hospital encounter of 08/15/16 (from the past 24 hour(s))  Glucose, capillary     Status: Abnormal   Collection Time: 08/18/16  3:59 PM  Result Value Ref Range   Glucose-Capillary 126 (H) 65 - 99 mg/dL  Glucose, capillary     Status: Abnormal   Collection Time: 08/18/16  7:53 PM  Result Value Ref Range   Glucose-Capillary 161 (H) 65 - 99 mg/dL  CBC     Status: Abnormal   Collection Time: 08/19/16  5:08 AM  Result Value Ref Range   WBC 6.9 4.0 - 10.5 K/uL   RBC 3.65 (L) 4.22 - 5.81 MIL/uL   Hemoglobin 10.6 (L) 13.0 - 17.0 g/dL   HCT 16.132.9 (L) 09.639.0 - 04.552.0 %   MCV 90.1 78.0 - 100.0 fL   MCH 29.0 26.0 - 34.0 pg   MCHC 32.2 30.0 - 36.0 g/dL   RDW 40.912.9 81.111.5 - 91.415.5 %   Platelets 165 150 - 400 K/uL  TSH     Status: None   Collection Time: 08/19/16  5:08 AM  Result Value Ref Range   TSH 1.421 0.350 - 4.500 uIU/mL  Magnesium     Status: None   Collection Time: 08/19/16  5:08 AM  Result Value Ref Range   Magnesium 2.0 1.7 - 2.4 mg/dL  Phosphorus     Status: None   Collection Time: 08/19/16  5:08 AM  Result  Value Ref Range   Phosphorus 2.5 2.5 - 4.6 mg/dL  Prealbumin     Status: Abnormal   Collection Time: 08/19/16  5:08 AM  Result Value Ref Range   Prealbumin 11.9 (L) 18 - 38 mg/dL  Comprehensive metabolic panel     Status: Abnormal   Collection Time: 08/19/16  5:08 AM  Result Value Ref Range   Sodium 138 135 - 145 mmol/L   Potassium 3.7 3.5 - 5.1 mmol/L   Chloride 103 101 - 111 mmol/L   CO2 28 22 - 32 mmol/L   Glucose, Bld 124 (H) 65 - 99 mg/dL   BUN 12 6 - 20 mg/dL   Creatinine, Ser 7.820.94 0.61 - 1.24 mg/dL   Calcium 8.4 (L) 8.9 - 10.3 mg/dL   Total Protein 6.5 6.5 - 8.1 g/dL   Albumin 2.7 (L) 3.5 - 5.0  g/dL   AST 26 15 - 41 U/L   ALT 22 17 - 63 U/L   Alkaline Phosphatase 72 38 - 126 U/L   Total Bilirubin 0.7 0.3 - 1.2 mg/dL   GFR calc non Af Amer >60 >60 mL/min   GFR calc Af Amer >60 >60 mL/min   Anion gap 7 5 - 15  Glucose, capillary     Status: Abnormal   Collection Time: 08/19/16  6:58 AM  Result Value Ref Range   Glucose-Capillary 155 (H) 65 - 99 mg/dL  Glucose, capillary     Status: Abnormal   Collection Time: 08/19/16 11:17 AM  Result Value Ref Range   Glucose-Capillary 204 (H) 65 - 99 mg/dL     PHYSICAL EXAM:   Gen: ambulating back to bed with walker and nurse, maintaining NWB on R leg Lungs: breathing unlabored Cardiac: regular  Ext:       Right Lower Extremity   Hip dressing changed  Wound looks excellent  No signs of infection   Ext warm   + DP pulse  Distal motor and sensory functions intact   No DCT   Assessment/Plan: 2 Days Post-Op   Principal Problem:   Closed fracture of distal end of femur (HCC) Active Problems:   Tobacco abuse, in remission   Polysubstance abuse   Alcohol use   Diabetes (HCC)   Vitamin D deficiency   Anti-infectives    Start     Dose/Rate Route Frequency Ordered Stop   08/17/16 1830  ceFAZolin (ANCEF) IVPB 1 g/50 mL premix     1 g 100 mL/hr over 30 Minutes Intravenous Every 6 hours 08/17/16 1803 08/18/16 0601    08/17/16 1100  ceFAZolin (ANCEF) IVPB 2g/100 mL premix     2 g 200 mL/hr over 30 Minutes Intravenous  Once 08/16/16 1109 08/17/16 1237    .  POD/HD#: 2  61 y/o Hispanic male with R distal femur fx, peri-implant    - fall   - R distal femur fracture, peri-implant (antegrade femoral nail) s/p ROH and retrograde IMN R distal femur fracture            NWB x 6-8 weeks             Unrestricted ROM R knee             No pillows under knee at rest. Elevate leg by placing pillows under ankle             Ice and elevate             PT/OT             Dressing changes as needed                - Pain management:             continue current regimen                          Dilaudid 1-2 mg IV q3h prn severe pain unrelenting pain (to be used only after all PO meds have been used)                         Percocet 5/325 1-2 po q6h prn pain                         Robaxin [phone] mg po q6h prn  spasms      + oxy IR for breakthrough pain 5-10 mg q6h between percocet    - ABL anemia/Hemodynamics             Stable                 - Medical issues                   + cocaine on UDS                         Hepatitis and HIV negative                 EtOH use                           CIWA   No evidence of withdrawal                Diabetes                         appreciate DM coordinator assistance   Continue SSI    Start metformin 500 mg daily    [facility]                Vitamin D deficiency                          Supplement                Urethral norrowing                          S/p foley placement by urology    Dc foley                - DVT/PE prophylaxis:             Lovenox x 21 days             Will lneed medication assistance   - ID:              periop abx completed      - Activity:             OOB as tolerated with assistance             NWB R leg             Unrestricted ROM R knee    - FEN/GI  prophylaxis/Foley/Lines:            continue IVF             CHO mod diet              dc foley                  - Dispo:             PT/OT             dc home tomorrow   Mearl Latin, PA-C [facility] [phone] (O) 08/19/2016, 12:57 PM

## 2016-08-19 NOTE — Progress Notes (Signed)
Physical Therapy Treatment Patient Details Name: Jay Anderson MRN: [id number] DOB: 31-Oct-1955 Today's Date: 08/19/2016    History of Present Illness 61 y.o. male admitted following a fall with resulting Rt distal femor fx. Pt now s/p IM nailing on 08/17/16. PMH: previous Rt femur ORIF.     PT Comments    Pt progressing with his mobility, ambulating 40 ft with rw and consistent with NWB status. Pt states that he will have his grandson 44(19 y.o.) to stay with him at D/C. May trial crutches per pt request at next session. Pt denies any questions or concerns following session.   Follow Up Recommendations  Home health PT;Supervision for mobility/OOB     Equipment Recommendations  Rolling walker with 5" wheels    Recommendations for Other Services       Precautions / Restrictions Precautions Precautions: Fall Restrictions Weight Bearing Restrictions: Yes RLE Weight Bearing: Non weight bearing    Mobility  Bed Mobility Overal bed mobility: Needs Assistance Bed Mobility: Supine to Sit     Supine to sit: Supervision;HOB elevated     General bed mobility comments: pt using rail to assist  Transfers Overall transfer level: Needs assistance Equipment used: Rolling walker (2 wheeled) Transfers: Sit to/from Stand Sit to Stand: Min guard         General transfer comment: good hand placement, maintaining NWB.   Ambulation/Gait Ambulation/Gait assistance: Min guard Ambulation Distance (Feet): 40 Feet Assistive device: Rolling walker (2 wheeled) Gait Pattern/deviations:  (swing-to pattern) Gait velocity: decreased   General Gait Details: Reminder for NWB, pt able to maintain during session.    Stairs            Wheelchair Mobility    Modified Rankin (Stroke Patients Only)       Balance Overall balance assessment: Needs assistance Sitting-balance support: No upper extremity supported;Feet supported Sitting balance-Leahy Scale: Good     Standing balance  support: Bilateral upper extremity supported Standing balance-Leahy Scale: Poor Standing balance comment: using rw for support                    Cognition Arousal/Alertness: Awake/alert Behavior During Therapy: WFL for tasks assessed/performed Overall Cognitive Status: Within Functional Limits for tasks assessed                      Exercises      General Comments General comments (skin integrity, edema, etc.): Using interpreter throughout session ([id number]#[id number]).       Pertinent Vitals/Pain Pain Assessment: 0-10 Pain Score: 5  Pain Location: Lt knee/leg Pain Descriptors / Indicators: Sore Pain Intervention(s): Limited activity within patient's tolerance;Monitored during session    Home Living                      Prior Function            PT Goals (current goals can now be found in the care plan section) Acute Rehab PT Goals Patient Stated Goal: not stated PT Goal Formulation: With patient Time For Goal Achievement: 09/01/16 Potential to Achieve Goals: Good Progress towards PT goals: Progressing toward goals    Frequency    Min 3X/week      PT Plan Current plan remains appropriate    Co-evaluation             End of Session Equipment Utilized During Treatment: Gait belt Activity Tolerance: Patient tolerated treatment well Patient left: in chair;with call bell/phone within reach Nurse Communication:  Mobility status;Weight bearing status PT Visit Diagnosis: Difficulty in walking, not elsewhere classified (R26.2);Pain Pain - Right/Left: Right Pain - part of body: Leg     Time: 1610-9604 PT Time Calculation (min) (ACUTE ONLY): 16 min  Charges:  $Gait Training: 8-22 mins                    G Codes:       Christiane Ha, PT, CSCS Pager [phone] Office [phone]  08/19/2016, 12:33 PM

## 2016-08-20 ENCOUNTER — Encounter (HOSPITAL_COMMUNITY): Payer: Self-pay | Admitting: Orthopedic Surgery

## 2016-08-20 DIAGNOSIS — E349 Endocrine disorder, unspecified: Secondary | ICD-10-CM | POA: Diagnosis present

## 2016-08-20 HISTORY — DX: Endocrine disorder, unspecified: E34.9

## 2016-08-20 LAB — URINE CULTURE: Culture: 40000 — AB

## 2016-08-20 LAB — TESTOSTERONE, FREE: Testosterone, Free: 4.7 pg/mL — ABNORMAL LOW (ref 6.6–18.1)

## 2016-08-20 LAB — PTH, INTACT AND CALCIUM
Calcium, Total (PTH): 8.3 mg/dL — ABNORMAL LOW (ref 8.6–10.2)
PTH: 47 pg/mL (ref 15–65)

## 2016-08-20 LAB — GLUCOSE, CAPILLARY
Glucose-Capillary: 154 mg/dL — ABNORMAL HIGH (ref 65–99)
Glucose-Capillary: 171 mg/dL — ABNORMAL HIGH (ref 65–99)

## 2016-08-20 LAB — SEX HORMONE BINDING GLOBULIN: Sex Hormone Binding: 47.8 nmol/L (ref 19.3–76.4)

## 2016-08-20 LAB — CALCITRIOL (1,25 DI-OH VIT D): Vit D, 1,25-Dihydroxy: 49.7 pg/mL (ref 19.9–79.3)

## 2016-08-20 LAB — TESTOSTERONE: Testosterone: 202 ng/dL — ABNORMAL LOW (ref 264–916)

## 2016-08-20 MED ORDER — ENOXAPARIN SODIUM 40 MG/0.4ML ~~LOC~~ SOLN: 40.0000 mg | SUBCUTANEOUS | 0 refills | Status: AC

## 2016-08-20 MED ORDER — DOCUSATE SODIUM 100 MG PO CAPS: 100.0000 mg | ORAL_CAPSULE | Freq: Two times a day (BID) | ORAL | 0 refills | Status: AC

## 2016-08-20 MED ORDER — METFORMIN HCL 500 MG PO TABS: 500.0000 mg | ORAL_TABLET | Freq: Every day | ORAL | 1 refills | Status: AC

## 2016-08-20 MED ORDER — METHOCARBAMOL 500 MG PO TABS: 500.0000 mg | ORAL_TABLET | Freq: Four times a day (QID) | ORAL | 0 refills | Status: AC | PRN

## 2016-08-20 MED ORDER — OXYCODONE-ACETAMINOPHEN 5-325 MG PO TABS
1.0000 | ORAL_TABLET | Freq: Four times a day (QID) | ORAL | 0 refills | Status: AC | PRN
Start: 2016-08-20 — End: ?

## 2016-08-20 NOTE — Progress Notes (Signed)
Physical Therapy Treatment Patient Details Name: Jay Anderson MRN: [id number] DOB: 11/05/1955 Today's Date: 08/20/2016    History of Present Illness 61 y.o. male admitted following a fall with resulting Rt distal femor fx. Pt now s/p IM nailing on 08/17/16. PMH: previous Rt femur ORIF.     PT Comments    Pt wheeled to gym for stair training. Stair training was done through interpreter. Initially, taught to use crutches to hop up the stairs, but after practicing hopping pt could not clear L LE enough to hop up step. Pt then taught how to bump himself up the stairs on his buttocks and was able to complete with min guard of sit<>stand to the stairs and supervision for bumping himself up the stairs. Pt with good adherence to NWB on R LE Pt requires continued skilled PT for gait and stair training using crutches to improve his strength and endurance while in acute environment to be able to safely navigate his home environment.    Follow Up Recommendations  Home health PT;Supervision for mobility/OOB     Equipment Recommendations  Crutches       Precautions / Restrictions Precautions Precautions: Fall Restrictions Weight Bearing Restrictions: Yes RLE Weight Bearing: Non weight bearing    Mobility  Bed Mobility Overal bed mobility: Modified Independent Bed Mobility: Supine to Sit     Supine to sit: Supervision     General bed mobility comments: used rail to come to upright  Transfers Overall transfer level: Needs assistance Equipment used: Crutches Transfers: Sit to/from Stand Sit to Stand: Min guard         General transfer comment: able to push off from bed and crutches to maintain NWB  Ambulation/Gait Ambulation/Gait assistance: Min assist Ambulation Distance (Feet): 15 Feet (into and out of bathroom) Assistive device: Crutches Gait Pattern/deviations: Decreased step length - left;Trunk flexed (swing-to pattern) Gait velocity: decreased Gait velocity  interpretation: Below normal speed for age/gender General Gait Details: Pt able to maintain NWB    Stairs Stairs: Yes   Stair Management: One rail Left;With crutches;Seated/boosting Number of Stairs: 4 General stair comments: Pt wanted to try hopping up the steps using both crutches but was after multilple tries was not able to hop high enough to clear the step. Pt then taught how to bump himself up and down the stairs on his buttocks Pt able to get down to stairs using rail and crutches and then was able to bump up and down 2 stairs twice before coming sit to stand from stairs. Pt with good adherance to NWB restriction.   Wheelchair Mobility    Modified Rankin (Stroke Patients Only)       Balance Overall balance assessment: Needs assistance Sitting-balance support: No upper extremity supported;Feet supported Sitting balance-Leahy Scale: Good     Standing balance support: Bilateral upper extremity supported Standing balance-Leahy Scale: Poor Standing balance comment: used crutches to maintain                    Cognition Arousal/Alertness: Awake/alert Behavior During Therapy: WFL for tasks assessed/performed Overall Cognitive Status: Within Functional Limits for tasks assessed                         General Comments General comments (skin integrity, edema, etc.): used interpreter [phone]      Pertinent Vitals/Pain Pain Assessment: Faces Pain Score: 7  Faces Pain Scale: Hurts even more Pain Location: Lt knee/leg Pain Descriptors / Indicators: Sore Pain  Intervention(s): Monitored during session  VSS           PT Goals (current goals can now be found in the care plan section) Acute Rehab PT Goals Patient Stated Goal: not stated PT Goal Formulation: With patient Time For Goal Achievement: 09/01/16 Potential to Achieve Goals: Good Progress towards PT goals: Progressing toward goals    Frequency    Min 3X/week      PT Plan Current plan  remains appropriate    Co-evaluation             End of Session Equipment Utilized During Treatment: Gait belt Activity Tolerance: Patient tolerated treatment well Patient left: with call bell/phone within reach;in bed Nurse Communication: Mobility status;Weight bearing status PT Visit Diagnosis: Difficulty in walking, not elsewhere classified (R26.2);Pain Pain - Right/Left: Right Pain - part of body: Leg     Time: 1610-9604 PT Time Calculation (min) (ACUTE ONLY): 46 min  Charges:  $Gait Training: 38-52 mins $Therapeutic Activity: 8-22 mins                    G Codes:       Elon Alas Fleet 08/20/2016, 3:52 PM  Courtney Paris. Beverely Risen PT, DPT Acute Rehabilitation  [phone] Pager [phone]

## 2016-08-20 NOTE — Discharge Instructions (Signed)
[facility] Discharge Instructions   General Discharge Instructions  WEIGHT BEARING STATUS: nonweightbearing right leg  RANGE OF MOTION/ACTIVITY: unrestricted range of motion right hip and knee  Wound Care: daily dressing changes starting on 08/22/2016. See instructions below   Discharge Wound Care Instructions  Do NOT apply any ointments, solutions or lotions to pin sites or surgical wounds.  These prevent needed drainage and even though solutions like hydrogen peroxide kill bacteria, they also damage cells lining the pin sites that help fight infection.  Applying lotions or ointments can keep the wounds moist and can cause them to breakdown and open up as well. This can increase the risk for infection. When in doubt call the office.  Surgical incisions should be dressed daily.  If any drainage is noted, use one layer of adaptic, then gauze, Kerlix, and an ace wrap.  Once the incision is completely dry and without drainage, it may be left open to air out.  Showering may begin 36-48 hours later.  Cleaning gently with soap and water.  Traumatic wounds should be dressed daily as well.    One layer of adaptic, gauze, Kerlix, then ace wrap.  The adaptic can be discontinued once the draining has ceased    If you have a wet to dry dressing: wet the gauze with saline the squeeze as much saline out so the gauze is moist (not soaking wet), place moistened gauze over wound, then place a dry gauze over the moist one, followed by Kerlix wrap, then ace wrap.    PAIN MEDICATION USE AND EXPECTATIONS  You have likely been given narcotic medications to help control your pain.  After a traumatic event that results in an fracture (broken bone) with or without surgery, it is ok to use narcotic pain medications to help control one's pain.  We understand that everyone responds to pain differently and each individual patient will be evaluated on a regular basis for the continued need for narcotic  medications. Ideally, narcotic medication use should last no more than 6-8 weeks (coinciding with fracture healing).   As a patient it is your responsibility as well to monitor narcotic medication use and report the amount and frequency you use these medications when you come to your office visit.   We would also advise that if you are using narcotic medications, you should take a dose prior to therapy to maximize you participation.  IF YOU ARE ON NARCOTIC MEDICATIONS IT IS NOT PERMISSIBLE TO OPERATE A MOTOR VEHICLE (MOTORCYCLE/CAR/TRUCK/MOPED) OR HEAVY MACHINERY DO NOT MIX NARCOTICS WITH OTHER CNS (CENTRAL NERVOUS SYSTEM) DEPRESSANTS SUCH AS ALCOHOL  Diet: as you were eating previously.  Can use over the counter stool softeners and bowel preparations, such as Miralax, to help with bowel movements.  Narcotics can be constipating.  Be sure to drink plenty of fluids    STOP SMOKING OR USING NICOTINE PRODUCTS!!!!  As discussed nicotine severely impairs your body's ability to heal surgical and traumatic wounds but also impairs bone healing.  Wounds and bone heal by forming microscopic blood vessels (angiogenesis) and nicotine is a vasoconstrictor (essentially, shrinks blood vessels).  Therefore, if vasoconstriction occurs to these microscopic blood vessels they essentially disappear and are unable to deliver necessary nutrients to the healing tissue.  This is one modifiable factor that you can do to dramatically increase your chances of healing your injury.    (This means no smoking, no nicotine gum, patches, etc)  DO NOT USE NONSTEROIDAL ANTI-INFLAMMATORY DRUGS (NSAID'S)  Using  products such as Advil (ibuprofen), Aleve (naproxen), Motrin (ibuprofen) for additional pain control during fracture healing can delay and/or prevent the healing response.  If you would like to take over the counter (OTC) medication, Tylenol (acetaminophen) is ok.  However, some narcotic medications that are given for pain  control contain acetaminophen as well. Therefore, you should not exceed more than 4000 mg of tylenol in a day if you do not have liver disease.  Also note that there are may OTC medicines, such as cold medicines and allergy medicines that my contain tylenol as well.  If you have any questions about medications and/or interactions please ask your doctor/PA or your pharmacist.      ICE AND ELEVATE INJURED/OPERATIVE EXTREMITY  Using ice and elevating the injured extremity above your heart can help with swelling and pain control.  Icing in a pulsatile fashion, such as 20 minutes on and 20 minutes off, can be followed.    Do not place ice directly on skin. Make sure there is a barrier between to skin and the ice pack.    Using frozen items such as frozen peas works well as the conform nicely to the are that needs to be iced.  USE AN ACE WRAP OR TED HOSE FOR SWELLING CONTROL  In addition to icing and elevation, Ace wraps or TED hose are used to help limit and resolve swelling.  It is recommended to use Ace wraps or TED hose until you are informed to stop.    When using Ace Wraps start the wrapping distally (farthest away from the body) and wrap proximally (closer to the body)   Example: If you had surgery on your leg or thing and you do not have a splint on, start the ace wrap at the toes and work your way up to the thigh        If you had surgery on your upper extremity and do not have a splint on, start the ace wrap at your fingers and work your way up to the upper arm  IF YOU ARE IN A SPLINT OR CAST DO NOT REMOVE IT FOR ANY REASON   If your splint gets wet for any reason please contact the office immediately. You may shower in your splint or cast as long as you keep it dry.  This can be done by wrapping in a cast cover or garbage back (or similar)  Do Not stick any thing down your splint or cast such as pencils, money, or hangers to try and scratch yourself with.  If you feel itchy take benadryl as  prescribed on the bottle for itching  IF YOU ARE IN A CAM BOOT (BLACK BOOT)  You may remove boot periodically. Perform daily dressing changes as noted below.  Wash the liner of the boot regularly and wear a sock when wearing the boot. It is recommended that you sleep in the boot until told otherwise  CALL THE OFFICE WITH ANY QUESTIONS OR CONCERNS: [phone]

## 2016-08-20 NOTE — Discharge Summary (Signed)
Orthopaedic Trauma Service (OTS)  Patient [name] MRN: [id number] DOB/AGE: 10/31/1955 61 y.o.  Admit date: 08/15/2016 Discharge date: 08/20/2016  Admission Diagnoses: Closed right distal femur fracture  Retained orthopaedic implant R femur  Polysubstance abuse (cocaine, alcohol)  Discharge Diagnoses:  Principal Problem:   Closed fracture of distal end of femur ([employer]) Active Problems:   Tobacco abuse, in remission   Polysubstance abuse   Alcohol use   Diabetes ([place])   Vitamin D deficiency   Procedures Performed: 08/17/2016- Dr. Marcelino Scot  1. Retrograde IM nailing of the right femur using a Biomet 12 x 380 mm     statically locked [place] nail. 2. Hardware removal, right femur including screws and intramedullary     nail.  08/17/2016- Dr. Louis Meckel  placement of complicated foley for Non-compliant and resistant Urethra.   Discharged Condition: good  Hospital Course:   61 year old Hispanic male admitted to [facility] on 08/15/2016 after sustaining a fall the night prior. Patient reportedly been drinking function. They have onset of knee pain but did not present to the emergency room the following day. The patient was found to have a right distal femur fracture which was around the distal aspect of a antegrade femoral nail. Orthopedic trauma service consult on the patient. We took the patient to the OR on 08/17/2016. Procedure was performed as noted above. Of note that intraoperatively we did attempt place a Foley preop however there was significant stricture and the noncompliance of the urethra as such urology was consult intraoperatively did come to put a Foley in. No additional workup was conducted. A urinalysis was performed which was negative. After surgery patient was transferred back to the orthopedic floor for observation and pain control. No significant issues were encountered during the patient's hospital stay. Patient was originally admitted to the medicine service  however because he just really have isolated orthopedic injuries orthopedic trauma service did assume primary role. We did a workup patient in terms of metabolic bone disease given his alcoholism also in addition he did have a positive toxicology screen notable for cocaine. His hepatitis and HIV screens were negative. Patient did vitamin D deficiency testosterone deficiency. Additionally patient was also found to be diabetic hemoglobin A1c of 7%. He be [facility] to establish care. During his stay patient was covered on sliding insulin. Diabetes care coordinator was consulted with the diagnosis with patient and gave him educational material. Patient was also started on metformin 500 mg daily. Patient was covered with Lovenox for DVT and PE prophylaxis. We did arrange for management program to acquire Lovenox for him at discharge. Patient worked with therapy regularly. On postoperative day #3 patient was deemed stable for discharge to home surgery DME and family supervision. Patient discharged in stable condition. Tolerating diet. Voiding without difficulty. His Foley catheter was discontinued on postoperative day #2.  Consults: urology and internal medicine   Significant Diagnostic Studies: labs:  Results for NAZAIAH, NAVARRETE (MRN [id number]) as of 08/20/2016 16:10  Ref. Range 08/19/2016 05:08  Sodium Latest Ref Range: 135 - 145 mmol/L 138  Potassium Latest Ref Range: 3.5 - 5.1 mmol/L 3.7  Chloride Latest Ref Range: 101 - 111 mmol/L 103  CO2 Latest Ref Range: 22 - 32 mmol/L 28  Glucose Latest Ref Range: 65 - 99 mg/dL 124 (H)  BUN Latest Ref Range: 6 - 20 mg/dL 12  Creatinine Latest Ref Range: 0.61 - 1.24 mg/dL 0.94  Calcium Latest Ref Range: 8.9 - 10.3 mg/dL  8.4 (L)  Anion gap Latest Ref Range: 5 - 15  7  Phosphorus Latest Ref Range: 2.5 - 4.6 mg/dL 2.5  Magnesium Latest Ref Range: 1.7 - 2.4 mg/dL 2.0  Alkaline Phosphatase Latest Ref Range: 38 - 126 U/L 72   Albumin Latest Ref Range: 3.5 - 5.0 g/dL 2.7 (L)  AST Latest Ref Range: 15 - 41 U/L 26  ALT Latest Ref Range: 17 - 63 U/L 22  Total Protein Latest Ref Range: 6.5 - 8.1 g/dL 6.5  Total Bilirubin Latest Ref Range: 0.3 - 1.2 mg/dL 0.7  PREALBUMIN Latest Ref Range: 18 - 38 mg/dL 11.9 (L)  EGFR (African American) Latest Ref Range: >60 mL/min >60  EGFR (Non-African Amer.) Latest Ref Range: >60 mL/min >60  Vit D, 1,25-Dihydroxy Latest Ref Range: 19.9 - 79.3 pg/mL 49.7  WBC Latest Ref Range: 4.0 - 10.5 K/uL 6.9  RBC Latest Ref Range: 4.22 - 5.81 MIL/uL 3.65 (L)  Hemoglobin Latest Ref Range: 13.0 - 17.0 g/dL 10.6 (L)  HCT Latest Ref Range: 39.0 - 52.0 % 32.9 (L)  MCV Latest Ref Range: 78.0 - 100.0 fL 90.1  MCH Latest Ref Range: 26.0 - 34.0 pg 29.0  MCHC Latest Ref Range: 30.0 - 36.0 g/dL 32.2  RDW Latest Ref Range: 11.5 - 15.5 % 12.9  Platelets Latest Ref Range: 150 - 400 K/uL 165    Results for RUDRANSH, BELLANCA (MRN [id number]) as of 08/20/2016 16:10  Ref. Range 08/19/2016 05:08  Sex Horm Binding Glob, Serum Latest Ref Range: 19.3 - 76.4 nmol/L 47.8  Testosterone Latest Ref Range: 264 - 916 ng/dL 202 (L)  Testosterone Free Latest Ref Range: 6.6 - 18.1 pg/mL 4.7 (L)  Results for VERONICA, GUERRANT (MRN [id number]) as of 08/20/2016 16:10  Ref. Range 08/19/2016 05:08  PTH Latest Ref Range: 15 - 65 pg/mL 47  TSH Latest Ref Range: 0.350 - 4.500 uIU/mL 1.421   Results for GORDAN, GRELL (MRN [id number]) as of 08/20/2016 16:10  Ref. Range 08/16/2016 12:47  Vitamin D, 25-Hydroxy Latest Ref Range: 30.0 - 100.0 ng/mL 13.8 (L)   Results for ABDULRAHMAN, BRACEY (MRN [id number]) as of 08/20/2016 16:10  Ref. Range 08/16/2016 12:47  Hemoglobin A1C Latest Ref Range: 4.8 - 5.6 % 7.5 (H)    Treatments: IV hydration, antibiotics: Ancef, analgesia: dilaudid, percocet, oxy IR, anticoagulation: LMW heparin, insulin: sliding scale- novolog, therapies: PT, OT and RN and surgery: as above   Discharge Exam:        Orthopedic Trauma Service Progress Note      Subjective:   Patient reports pain as mild.     Doing well Ready to go home  No new issues      ROS As above    Objective:    VITALS:         Vitals:    08/18/16 1947 08/19/16 0600 08/19/16 2054 08/20/16 0645  BP: 135/83 128/87 136/88 107/65  Pulse: (!) 117 100 82 84  Resp: 16 16 16 16   Temp: 99.4 F (37.4 C) 99.1 F (37.3 C) 99.1 F (37.3 C) 98.2 F (36.8 C)  TempSrc: Oral Oral Oral Oral  SpO2: 94% 95% 96% 95%  Weight:          Height:              Intake/Output      03/08 0701 - 03/09 0700 03/09 0701 - 03/10 0700   P.O. 960    Total Intake(mL/kg) 960 (11.8)    Urine (mL/kg/hr) 1050 (0.5)  Stool 0 (0)    Total Output 1050     Net -90          Urine Occurrence  1 x   Stool Occurrence 1 x       LABS   Lab Results Last 24 Hours       Results for orders placed or performed during the hospital encounter of 08/15/16 (from the past 24 hour(s))  Glucose, capillary     Status: Abnormal    Collection Time: 08/19/16  5:34 PM  Result Value Ref Range    Glucose-Capillary 170 (H) 65 - 99 mg/dL  Glucose, capillary     Status: Abnormal    Collection Time: 08/19/16  9:25 PM  Result Value Ref Range    Glucose-Capillary 114 (H) 65 - 99 mg/dL  Glucose, capillary     Status: Abnormal    Collection Time: 08/20/16  6:49 AM  Result Value Ref Range    Glucose-Capillary 154 (H) 65 - 99 mg/dL  Glucose, capillary     Status: Abnormal    Collection Time: 08/20/16 11:41 AM  Result Value Ref Range    Glucose-Capillary 171 (H) 65 - 99 mg/dL    Comment 1 Repeat Test      Comment 2 Document in Chart            PHYSICAL EXAM:  Gen: resting comfortably in bed, NAD, appears well  Lungs: breathing unlabored Ext:       Right Lower Extremity              Hip dressing c/d/i             Lower leg dressing c/d/i             No signs of infection              Ext warm              + DP pulse             Distal motor and sensory  functions intact              No DCT      Assessment/Plan: 3 Days Post-Op    Principal Problem:   Closed fracture of distal end of femur ([facility]) Active Problems:   Tobacco abuse, in remission   Polysubstance abuse   Alcohol use   Diabetes ([facility])   Vitamin D deficiency               Anti-infectives     Start     Dose/Rate Route Frequency Ordered Stop    08/17/16 1830   ceFAZolin (ANCEF) IVPB 1 g/50 mL premix     1 g 100 mL/hr over 30 Minutes Intravenous Every 6 hours 08/17/16 1803 08/18/16 0601    08/17/16 1100   ceFAZolin (ANCEF) IVPB 2g/100 mL premix     2 g 200 mL/hr over 30 Minutes Intravenous  Once 08/16/16 1109 08/17/16 1237     .   POD/HD#: 108   61 y/o Hispanic male with R distal femur fx, peri-implant    - fall   - R distal femur fracture, peri-implant (antegrade femoral nail) s/p ROH and retrograde IMN R distal femur fracture            NWB x 6-8 weeks             Unrestricted ROM R knee  No pillows under knee at rest. Elevate leg by placing pillows under ankle             Ice and elevate             PT/OT             Dressing changes as needed     - Pain management:             dc home with percocet and robaxin      - ABL anemia/Hemodynamics             Stable     - Medical issues                 + cocaine on UDS                         Hepatitis and HIV negative                 EtOH use                           CIWA                         No evidence of withdrawal                Diabetes                         appreciate DM coordinator assistance                         Continue SSI                          metformin 500 mg daily                           outpt [facility] with [facility]                          Instructions given to call for appointment Monday morning                Vitamin D deficiency                          Supplement                Urethral norrowing                           S/p foley placement by urology                          foley dc'd     - DVT/PE prophylaxis:             Lovenox x 21 days             instructed on how to use MATCH program    - ID:              periop abx completed      - Activity:             OOB  as tolerated with assistance             NWB R leg             Unrestricted ROM R knee    - FEN/GI prophylaxis/Foley/Lines:            dc IV and IVF             Carb mod diet       - Dispo:             PT/OT             dc home             Follow up with ortho in 2 weeks     Disposition: Home with DME   Discharge Instructions    Call MD / Call 911    Complete by:  As directed    If you experience chest pain or shortness of breath, CALL 911 and be transported to the hospital emergency room.  If you develope a fever above 101 F, pus (white drainage) or increased drainage or redness at the wound, or calf pain, call your surgeon's office.   Constipation Prevention    Complete by:  As directed    Drink plenty of fluids.  Prune juice may be helpful.  You may use a stool softener, such as Colace (over the counter) 100 mg twice a day.  Use MiraLax (over the counter) for constipation as needed.   Diet Carb Modified    Complete by:  As directed    Discharge instructions    Complete by:  As directed    Orthopaedic Trauma Service Discharge Instructions   General Discharge Instructions  WEIGHT BEARING STATUS: nonweightbearing right leg  RANGE OF MOTION/ACTIVITY: unrestricted range of motion right hip and knee  Wound Care: daily dressing changes starting on 08/22/2016. See instructions below   Discharge Wound Care Instructions  Do NOT apply any ointments, solutions or lotions to pin sites or surgical wounds.  These prevent needed drainage and even though solutions like hydrogen peroxide kill bacteria, they also damage cells lining the pin sites that help fight infection.  Applying lotions or ointments can keep the wounds moist and  can cause them to breakdown and open up as well. This can increase the risk for infection. When in doubt call the office.  Surgical incisions should be dressed daily.  If any drainage is noted, use one layer of adaptic, then gauze, Kerlix, and an ace wrap.  Once the incision is completely dry and without drainage, it may be left open to air out.  Showering may begin 36-48 hours later.  Cleaning gently with soap and water.  Traumatic wounds should be dressed daily as well.    One layer of adaptic, gauze, Kerlix, then ace wrap.  The adaptic can be discontinued once the draining has ceased    If you have a wet to dry dressing: wet the gauze with saline the squeeze as much saline out so the gauze is moist (not soaking wet), place moistened gauze over wound, then place a dry gauze over the moist one, followed by Kerlix wrap, then ace wrap.    PAIN MEDICATION USE AND EXPECTATIONS  You have likely been given narcotic medications to help control your pain.  After a traumatic event that results in an fracture (broken bone) with or without surgery, it is ok to use narcotic pain medications to help control one's pain.  We understand  that everyone responds to pain differently and each individual patient will be evaluated on a regular basis for the continued need for narcotic medications. Ideally, narcotic medication use should last no more than 6-8 weeks (coinciding with fracture healing).   As a patient it is your responsibility as well to monitor narcotic medication use and report the amount and frequency you use these medications when you come to your office visit.   We would also advise that if you are using narcotic medications, you should take a dose prior to therapy to maximize you participation.  IF YOU ARE ON NARCOTIC MEDICATIONS IT IS NOT PERMISSIBLE TO OPERATE A MOTOR VEHICLE (MOTORCYCLE/CAR/TRUCK/MOPED) OR HEAVY MACHINERY DO NOT MIX NARCOTICS WITH OTHER CNS (CENTRAL NERVOUS SYSTEM) DEPRESSANTS  SUCH AS ALCOHOL  Diet: as you were eating previously.  Can use over the counter stool softeners and bowel preparations, such as Miralax, to help with bowel movements.  Narcotics can be constipating.  Be sure to drink plenty of fluids    STOP SMOKING OR USING NICOTINE PRODUCTS!!!!  As discussed nicotine severely impairs your body's ability to heal surgical and traumatic wounds but also impairs bone healing.  Wounds and bone heal by forming microscopic blood vessels (angiogenesis) and nicotine is a vasoconstrictor (essentially, shrinks blood vessels).  Therefore, if vasoconstriction occurs to these microscopic blood vessels they essentially disappear and are unable to deliver necessary nutrients to the healing tissue.  This is one modifiable factor that you can do to dramatically increase your chances of healing your injury.    (This means no smoking, no nicotine gum, patches, etc)  DO NOT USE NONSTEROIDAL ANTI-INFLAMMATORY DRUGS (NSAID'S)  Using products such as Advil (ibuprofen), Aleve (naproxen), Motrin (ibuprofen) for additional pain control during fracture healing can delay and/or prevent the healing response.  If you would like to take over the counter (OTC) medication, Tylenol (acetaminophen) is ok.  However, some narcotic medications that are given for pain control contain acetaminophen as well. Therefore, you should not exceed more than 4000 mg of tylenol in a day if you do not have liver disease.  Also note that there are may OTC medicines, such as cold medicines and allergy medicines that my contain tylenol as well.  If you have any questions about medications and/or interactions please ask your doctor/PA or your pharmacist.      ICE AND ELEVATE INJURED/OPERATIVE EXTREMITY  Using ice and elevating the injured extremity above your heart can help with swelling and pain control.  Icing in a pulsatile fashion, such as 20 minutes on and 20 minutes off, can be followed.    Do not place ice  directly on skin. Make sure there is a barrier between to skin and the ice pack.    Using frozen items such as frozen peas works well as the conform nicely to the are that needs to be iced.  USE AN ACE WRAP OR TED HOSE FOR SWELLING CONTROL  In addition to icing and elevation, Ace wraps or TED hose are used to help limit and resolve swelling.  It is recommended to use Ace wraps or TED hose until you are informed to stop.    When using Ace Wraps start the wrapping distally (farthest away from the body) and wrap proximally (closer to the body)   Example: If you had surgery on your leg or thing and you do not have a splint on, start the ace wrap at the toes and work your way up to the thigh  If you had surgery on your upper extremity and do not have a splint on, start the ace wrap at your fingers and work your way up to the upper arm  IF YOU ARE IN A SPLINT OR CAST DO NOT REMOVE IT FOR ANY REASON   If your splint gets wet for any reason please contact the office immediately. You may shower in your splint or cast as long as you keep it dry.  This can be done by wrapping in a cast cover or garbage back (or similar)  Do Not stick any thing down your splint or cast such as pencils, money, or hangers to try and scratch yourself with.  If you feel itchy take benadryl as prescribed on the bottle for itching  IF YOU ARE IN A CAM BOOT (BLACK BOOT)  You may remove boot periodically. Perform daily dressing changes as noted below.  Wash the liner of the boot regularly and wear a sock when wearing the boot. It is recommended that you sleep in the boot until told otherwise  CALL THE OFFICE WITH ANY QUESTIONS OR CONCERNS: [phone]   Do not put a pillow under the knee. Place it under the heel.    Complete by:  As directed    Place pillow under ankle to elevate leg   Increase activity slowly as tolerated    Complete by:  As directed    Non weight bearing    Complete by:  As directed    Laterality:   right   Extremity:  Lower     Allergies as of 08/20/2016   No Known Allergies     Medication List    STOP taking these medications   ibuprofen 200 MG tablet Commonly known as:  ADVIL,MOTRIN     TAKE these medications   aspirin EC 81 MG tablet Take 81 mg by mouth daily.   docusate sodium 100 MG capsule Commonly known as:  COLACE Take 1 capsule (100 mg total) by mouth 2 (two) times daily.   enoxaparin 40 MG/0.4ML injection Commonly known as:  LOVENOX Inject 0.4 mLs (40 mg total) into the skin daily. Start taking on:  08/21/2016   metFORMIN 500 MG tablet Commonly known as:  GLUCOPHAGE Take 1 tablet (500 mg total) by mouth daily with breakfast. Start taking on:  08/21/2016   methocarbamol 500 MG tablet Commonly known as:  ROBAXIN Take 1-2 tablets (500-1,000 mg total) by mouth every 6 (six) hours as needed for muscle spasms.   oxyCODONE-acetaminophen 5-325 MG tablet Commonly known as:  PERCOCET/ROXICET Take 1-2 tablets by mouth every 6 (six) hours as needed for moderate pain or severe pain.      Follow-up Information    Ardis Hughs, MD Follow up.   Specialty:  Urology Why:  As needed for difficulty urinating Contact information: [address] [address] [phone]        Rozanna Box, MD. Schedule an appointment as soon as possible for a visit in 2 week(s).   Specialty:  Orthopedic Surgery Why:  follow up for Right leg fracture  Contact information: [address] [place] [address] [place]. Schedule an appointment as soon as possible for a visit in 1 week(s).   Why:  call and make appointment as soon as possible for evaluation of diabetes  Contact information: [address]  [place] [address] [phone]  Discharge Instructions and Plan:  61 y/o Hispanic male with R distal femur fx, peri-implant    - fall   - R distal  femur fracture, peri-implant (antegrade femoral nail) s/p ROH and retrograde IMN R distal femur fracture            NWB x 6-8 weeks             Unrestricted ROM R knee             No pillows under knee at rest. Elevate leg by placing pillows under ankle             Ice and elevate             PT/OT             Dressing changes as needed     - Pain management:             dc home with percocet and robaxin      - ABL anemia/Hemodynamics             Stable     - Medical issues                 + cocaine on UDS                         Hepatitis and HIV negative                 EtOH use                           was on CIWA during admission                          No evidence of withdrawal                Diabetes                         appreciate DM coordinator assistance                         Continue SSI                          metformin 500 mg daily                           outpt [facility]                          Instructions given to call for appointment Monday morning                Vitamin D deficiency                          Supplement                Urethral norrowing                          S/p foley placement by urology  foley dc'd     - DVT/PE prophylaxis:             Lovenox x 21 days             instructed on how to use MATCH program    - ID:              periop abx completed      - Activity:             OOB as tolerated with assistance             NWB R leg             Unrestricted ROM R knee    - FEN/GI prophylaxis/Foley/Lines:                         Carb mod diet       - Dispo:             PT/OT             dc home             Follow up with ortho in 2 weeks     Signed:  Jari Pigg, PA-C [facility] [phone] (P) 08/20/2016, 4:04 PM

## 2016-08-20 NOTE — Progress Notes (Signed)
Physical Therapy Treatment Patient Details Name: Jay Anderson MRN: [id number] DOB: [birth year] Today's Date: 08/20/2016    History of Present Illness 61 y.o. male admitted following a fall with resulting Rt distal femor fx. Pt now s/p IM nailing on 08/17/16. PMH: previous Rt femur ORIF.     PT Comments    Pt was modified independent with bed mobility. Pt was given crutch training through an interpreter and was able to utilize to come sit<>stand with min guard. After ambulating 10 feet to the bathroom, pt was able to ambulate 50 feet out in the hallway with the crutches maintaining his R LE NWB restrictions. Pt asked to return to the room due to fatigue and pain and did not want to attempt stairs at time of treatment. Pt requires skilled PT for stair training and continued gait training to be able to safely return to his home environment. PT will attempt to come back this afternoon to perform stair training.    Follow Up Recommendations  Home health PT;Supervision for mobility/OOB     Equipment Recommendations  Rolling walker with 5" wheels    Recommendations for Other Services       Precautions / Restrictions Precautions Precautions: Fall Restrictions Weight Bearing Restrictions: Yes RLE Weight Bearing: Non weight bearing    Mobility  Bed Mobility Overal bed mobility: Modified Independent Bed Mobility: Supine to Sit     Supine to sit: Supervision     General bed mobility comments: used rail to come to upright  Transfers Overall transfer level: Needs assistance Equipment used: Crutches Transfers: Sit to/from Stand Sit to Stand: Min guard         General transfer comment: able to maintain NWB, training to use two crutches together to come to upright  Ambulation/Gait Ambulation/Gait assistance: Min assist Ambulation Distance (Feet): 60 Feet (1x10 to bathroom, 1x50 out in hallway) Assistive device: Crutches Gait Pattern/deviations: Decreased step length -  left;Trunk flexed (swing-to pattern) Gait velocity: decreased Gait velocity interpretation: Below normal speed for age/gender General Gait Details: Pt able to maintain NWB throughout session.   Stairs            Wheelchair Mobility    Modified Rankin (Stroke Patients Only)       Balance Overall balance assessment: Needs assistance Sitting-balance support: No upper extremity supported;Feet supported Sitting balance-Leahy Scale: Good     Standing balance support: Bilateral upper extremity supported Standing balance-Leahy Scale: Poor Standing balance comment: used crutches to maintain                    Cognition Arousal/Alertness: Awake/alert Behavior During Therapy: WFL for tasks assessed/performed Overall Cognitive Status: Within Functional Limits for tasks assessed                      Exercises      General Comments General comments (skin integrity, edema, etc.): used interpreter throughout session      Pertinent Vitals/Pain Pain Assessment: 0-10 Pain Score: 7  Pain Location: Lt knee/leg Pain Descriptors / Indicators: Sore Pain Intervention(s): Limited activity within patient's tolerance  VSS           PT Goals (current goals can now be found in the care plan section) Acute Rehab PT Goals Patient Stated Goal: not stated PT Goal Formulation: With patient Time For Goal Achievement: 09/01/16 Potential to Achieve Goals: Good    Frequency    Min 3X/week      PT Plan Current plan remains appropriate  End of Session Equipment Utilized During Treatment: Gait belt Activity Tolerance: Patient tolerated treatment well Patient left: with call bell/phone within reach;in bed Nurse Communication: Mobility status;Weight bearing status PT Visit Diagnosis: Difficulty in walking, not elsewhere classified (R26.2);Pain Pain - Right/Left: Right Pain - part of body: Leg     Time: 1127-1150 PT Time Calculation (min) (ACUTE ONLY): 23  min  Charges:  $Gait Training: 8-22 mins $Therapeutic Activity: 8-22 mins                    G Codes:       Jay Anderson 08/20/2016, 12:25 PM\ Lanora Manis B. Beverely Risen PT, DPT Acute Rehabilitation  [phone] Pager [phone]

## 2016-08-20 NOTE — Care Management Note (Addendum)
  Case Management Note  Patient Details  Name: Jay Anderson MRN: [id number] Date of Birth: 1955/10/07  Subjective/Objective:    61 yr old male s/p IM Nailing of right hip.            Action/Plan: Patient speaks spanish only. CM used [place] interpreter line [facility], and provided letter. CM also had them explain [facility] and will contact him with that information. Patient will discharge home with his son. Case manager has explained that patient doesn't have insurance coverage for HHPT.    Expected Discharge Date:     08/20/16             Expected Discharge Plan:   Home self/Care   In-House Referral:  NA  Discharge planning Services  CM Consult, [facility] Program, [facility]  Post Acute Care Choice:  Durable Medical Equipment Choice offered to:  NA  DME Arranged:  3-N-1, Crutches DME Agency:  [facility]  HH Arranged:  NA HH Agency:     Status of Service:  In process, will continue to follow  If discussed at Long Length of Stay Meetings, dates discussed:    Additional Comments:  Durenda GuthrieBrady, Roey Coopman Naomi, RN 08/20/2016, 3:37 PM

## 2016-08-20 NOTE — Progress Notes (Signed)
Orthopedic Tech Progress Note Patient Details:  Jay Anderson 08-15-1955 [id number]  Ortho Devices Type of Ortho Device: Crutches Ortho Device/Splint Location: rle Ortho Device/Splint Interventions: Application   Jay Anderson 08/20/2016, 3:53 PM

## 2016-08-20 NOTE — Progress Notes (Signed)
Orthopedic Trauma Service Progress Note    Subjective:  Patient reports pain as mild.    Doing well Ready to go home  No new issues    ROS As above   Objective:   VITALS:   Vitals:   08/18/16 1947 08/19/16 0600 08/19/16 2054 08/20/16 0645  BP: 135/83 128/87 136/88 107/65  Pulse: (!) 117 100 82 84  Resp: 16 16 16 16   Temp: 99.4 F (37.4 C) 99.1 F (37.3 C) 99.1 F (37.3 C) 98.2 F (36.8 C)  TempSrc: Oral Oral Oral Oral  SpO2: 94% 95% 96% 95%  Weight:      Height:        Intake/Output      03/08 0701 - 03/09 0700 03/09 0701 - 03/10 0700   P.O. 960    Total Intake(mL/kg) 960 (11.8)    Urine (mL/kg/hr) 1050 (0.5)    Stool 0 (0)    Total Output 1050     Net -90          Urine Occurrence  1 x   Stool Occurrence 1 x      LABS  Results for orders placed or performed during the hospital encounter of 08/15/16 (from the past 24 hour(s))  Glucose, capillary     Status: Abnormal   Collection Time: 08/19/16  5:34 PM  Result Value Ref Range   Glucose-Capillary 170 (H) 65 - 99 mg/dL  Glucose, capillary     Status: Abnormal   Collection Time: 08/19/16  9:25 PM  Result Value Ref Range   Glucose-Capillary 114 (H) 65 - 99 mg/dL  Glucose, capillary     Status: Abnormal   Collection Time: 08/20/16  6:49 AM  Result Value Ref Range   Glucose-Capillary 154 (H) 65 - 99 mg/dL  Glucose, capillary     Status: Abnormal   Collection Time: 08/20/16 11:41 AM  Result Value Ref Range   Glucose-Capillary 171 (H) 65 - 99 mg/dL   Comment 1 Repeat Test    Comment 2 Document in Chart      PHYSICAL EXAM:  Gen: resting comfortably in bed, NAD, appears well  Lungs: breathing unlabored Ext:       Right Lower Extremity              Hip dressing c/d/i             Lower leg dressing c/d/i             No signs of infection              Ext warm              + DP pulse             Distal motor and sensory functions intact              No DCT     Assessment/Plan: 3 Days Post-Op   Principal Problem:   Closed fracture of distal end of femur (HCC) Active Problems:   Tobacco abuse, in remission   Polysubstance abuse   Alcohol use   Diabetes (HCC)   Vitamin D deficiency   Anti-infectives    Start     Dose/Rate Route Frequency Ordered Stop   08/17/16 1830  ceFAZolin (ANCEF) IVPB 1 g/50 mL premix     1 g 100 mL/hr over 30 Minutes Intravenous Every 6 hours 08/17/16 1803 08/18/16 0601   08/17/16 1100  ceFAZolin (ANCEF) IVPB 2g/100 mL premix  2 g 200 mL/hr over 30 Minutes Intravenous  Once 08/16/16 1109 08/17/16 1237    .  POD/HD#: 7  61 y/o Hispanic male with R distal femur fx, peri-implant    - fall   - R distal femur fracture, peri-implant (antegrade femoral nail) s/p ROH and retrograde IMN R distal femur fracture            NWB x 6-8 weeks             Unrestricted ROM R knee             No pillows under knee at rest. Elevate leg by placing pillows under ankle             Ice and elevate             PT/OT             Dressing changes as needed     - Pain management:             dc home with percocet and robaxin                            - ABL anemia/Hemodynamics             Stable                 - Medical issues                 + cocaine on UDS                         Hepatitis and HIV negative                 EtOH use                           CIWA                         No evidence of withdrawal                Diabetes                         appreciate DM coordinator assistance                         Continue SSI                          metformin 500 mg daily                           [facility]    Instructions given to call for appointment Monday morning                Vitamin D deficiency                          Supplement                Urethral norrowing                          S/p foley placement by  urology                           foley dc'd     - DVT/PE prophylaxis:             Lovenox x 21 days             instructed on how to use [facility] program    - ID:              periop abx completed      - Activity:             OOB as tolerated with assistance             NWB R leg             Unrestricted ROM R knee    - FEN/GI prophylaxis/Foley/Lines:            dc IV and IVF  Carb mod diet                  - Dispo:             PT/OT             dc home  Follow up with ortho in 2 weeks  Docie Abramovich W. Minami Arriaga, PA-C [facility] [phone](P) [phone] (O) 08/20/2016, 3:54 PM

## 2016-08-23 LAB — TESTOSTERONE, % FREE: Testosterone-% Free: 1.3 % — ABNORMAL LOW (ref 0.2–0.7)

## 2016-08-23 NOTE — Care Management (Signed)
[facility]. Contacted [facility] office and scheduled patient appointment there with Jerrilyn CairoKim Harris for 9AM on Wednesday, September 01, 2016.Case manager requested that spanish interpreter be present.  CM called patient's phone- [phone] and left detailed message.   Vance PeperSusan Romar Woodrick, RN BSN Case Manager

## 2016-09-01 ENCOUNTER — Ambulatory Visit: Payer: Self-pay | Admitting: Family Medicine

## 2017-09-05 IMAGING — RF DG C-ARM 61-120 MIN
1 series · 5 of 5 positions shown · non-contrast
Comparison: 08/15/2016

CLINICAL DATA: Right femur surgery

EXAM:
RIGHT FEMUR 2 VIEWS; DG C-ARM 61-120 MIN

[Series 1: run · 5 of 5 slices shown]
[im 1/5]
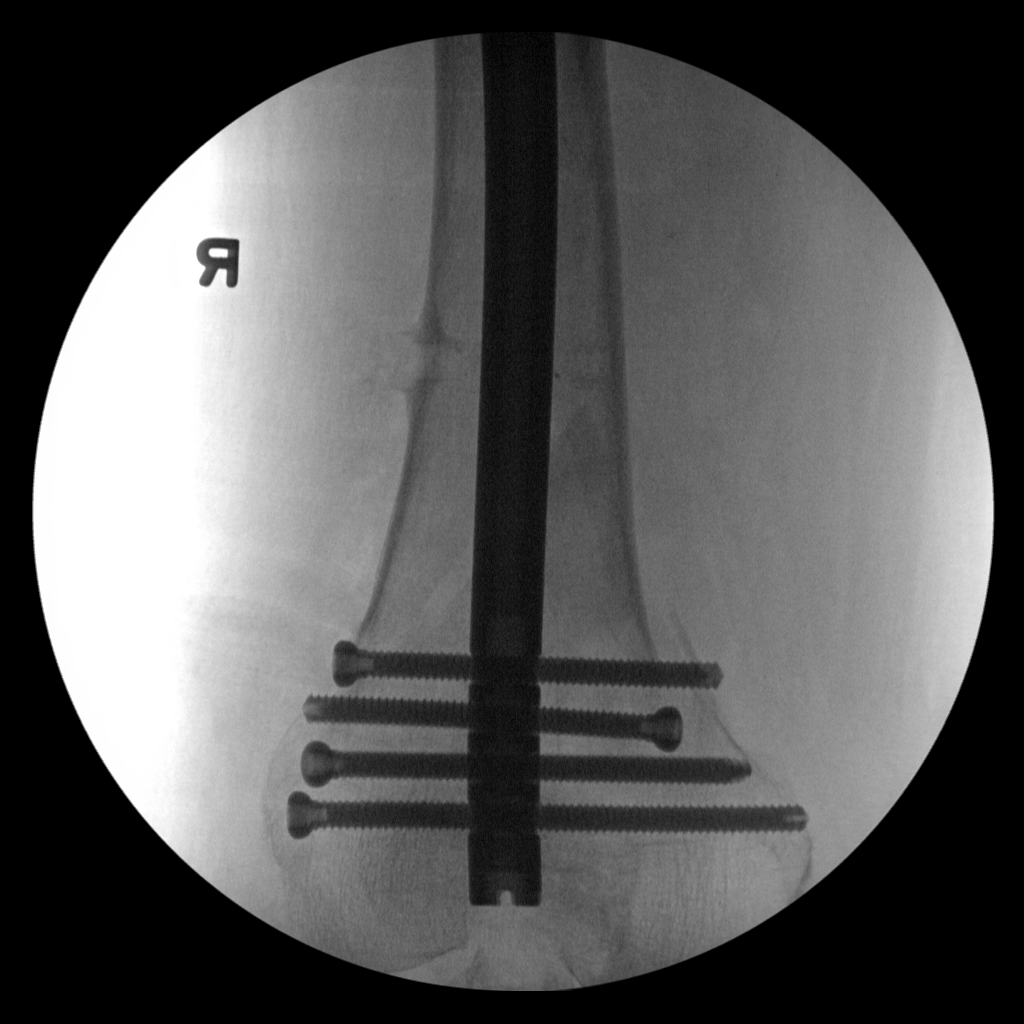
[im 2/5]
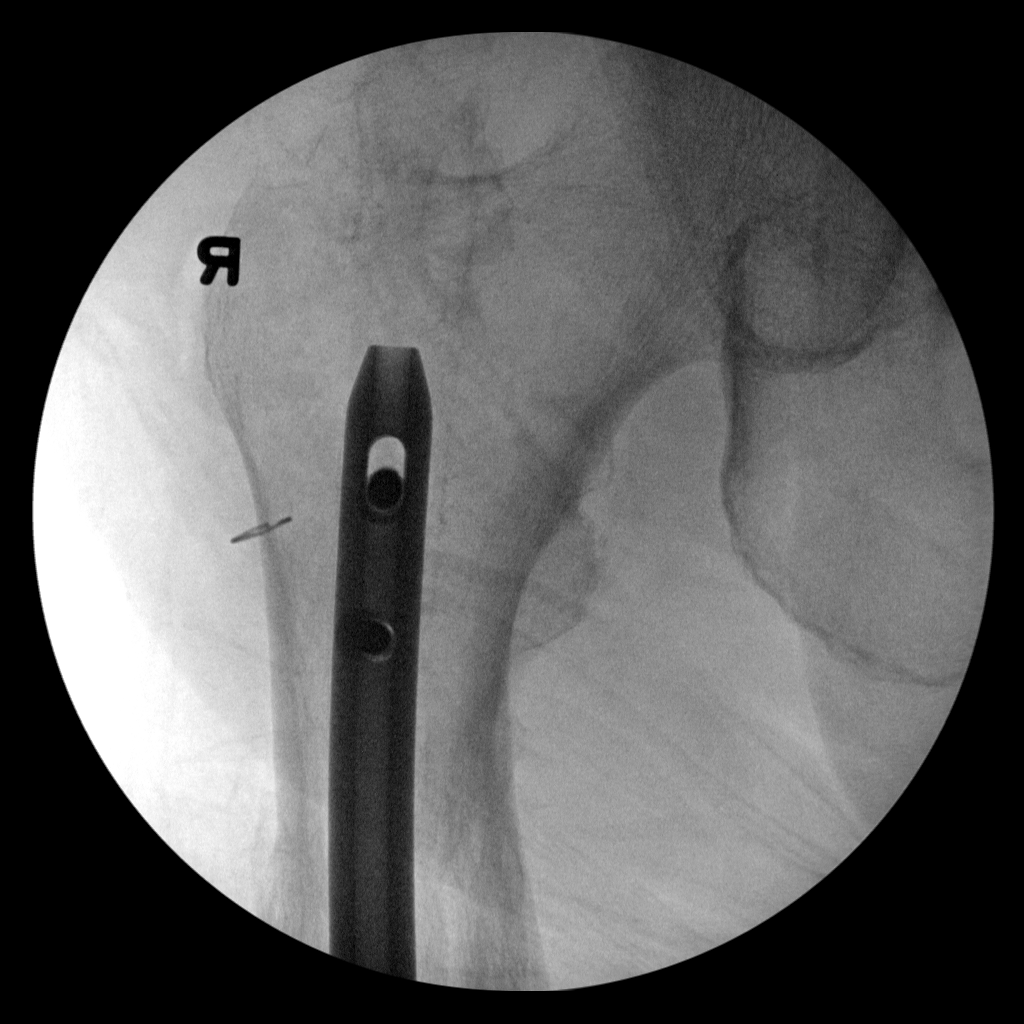
[im 3/5]
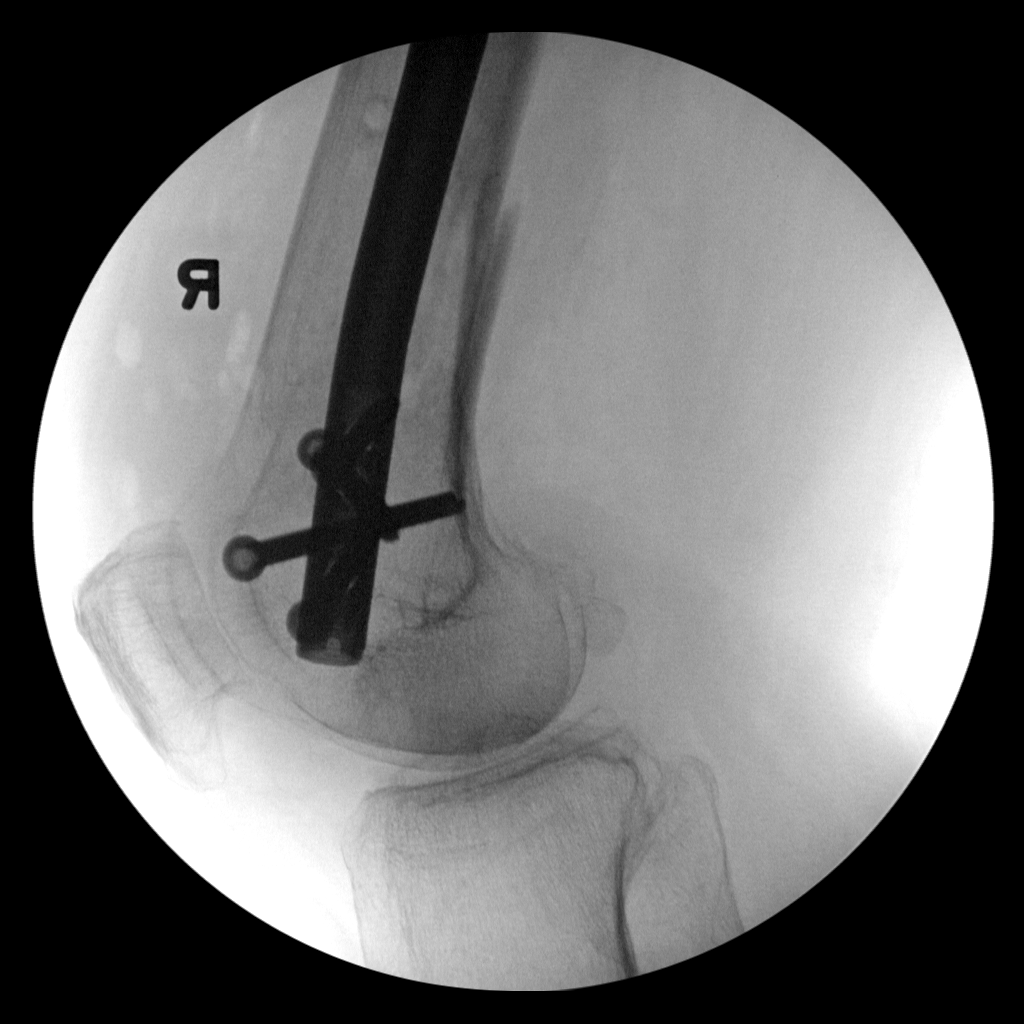
[im 4/5]
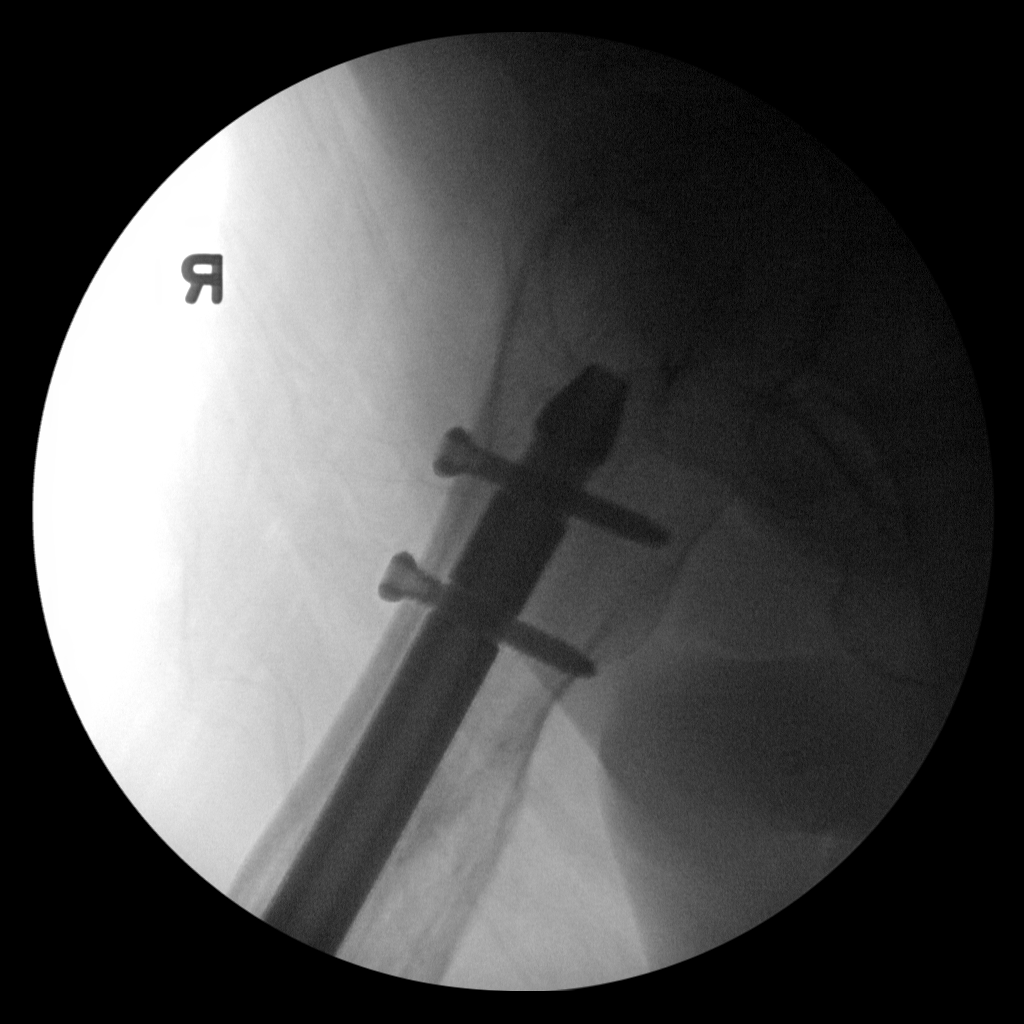
[im 5/5]
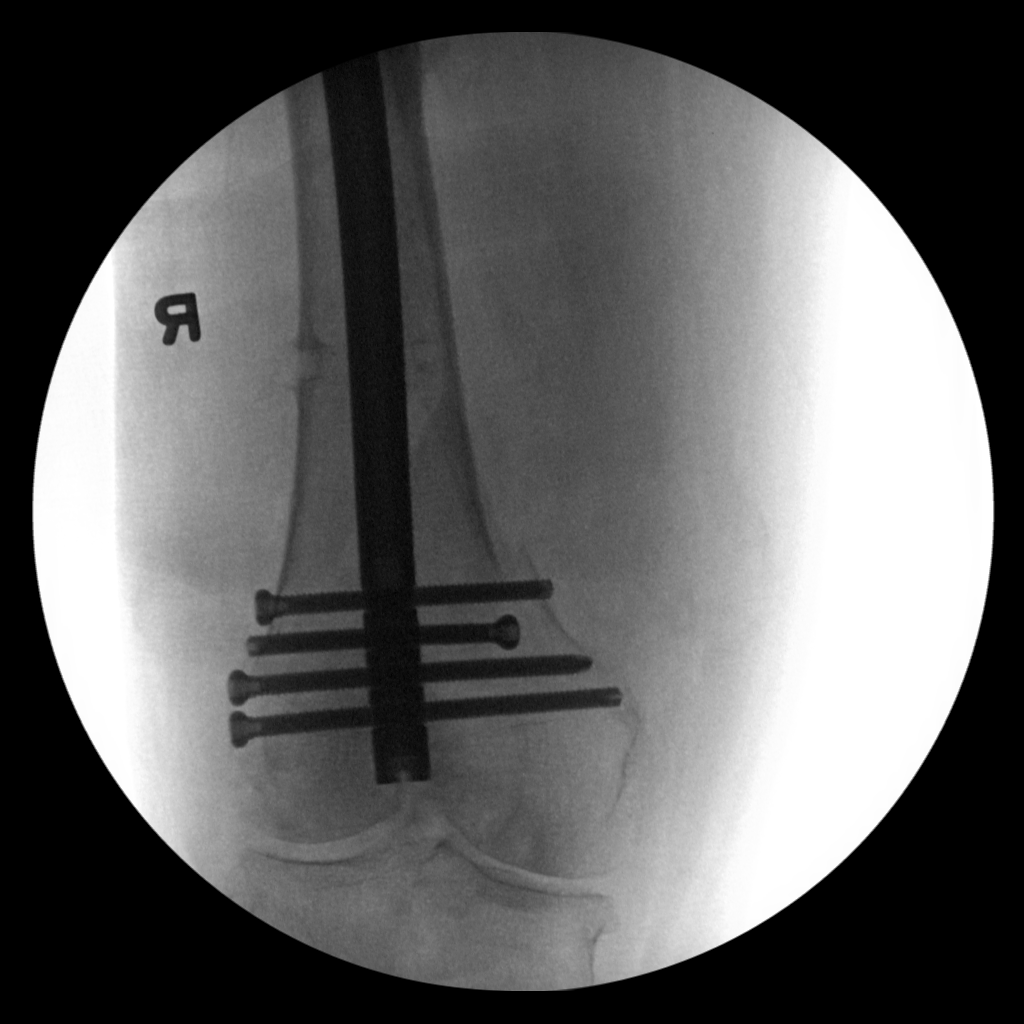

[5 of 5 positions shown; findings below may reference images not displayed]

FINDINGS: Three low resolution spot intraoperative views of the right femur.
Total fluoroscopy time 1 minutes 31 seconds.

The images demonstrate intramedullary rod and multiple screw
fixation of the right femur. Four screws fixate the acute distal
femoral fracture. A lucent defect in the distal shaft of the femur
corresponds to old hardware site.
IMPRESSION: Intraoperative fluoroscopic assistance provided during right femur
surgery

## 2017-09-05 IMAGING — CR DG FEMUR 2+V PORT*R*
4 series · 4 of 4 positions shown · non-contrast
Comparison: None.

CLINICAL DATA: 60 y/o M; right femur fracture post intramedullary
nail placement.

EXAM:
RIGHT FEMUR PORTABLE 2 VIEW

[AP (1 of 4)]
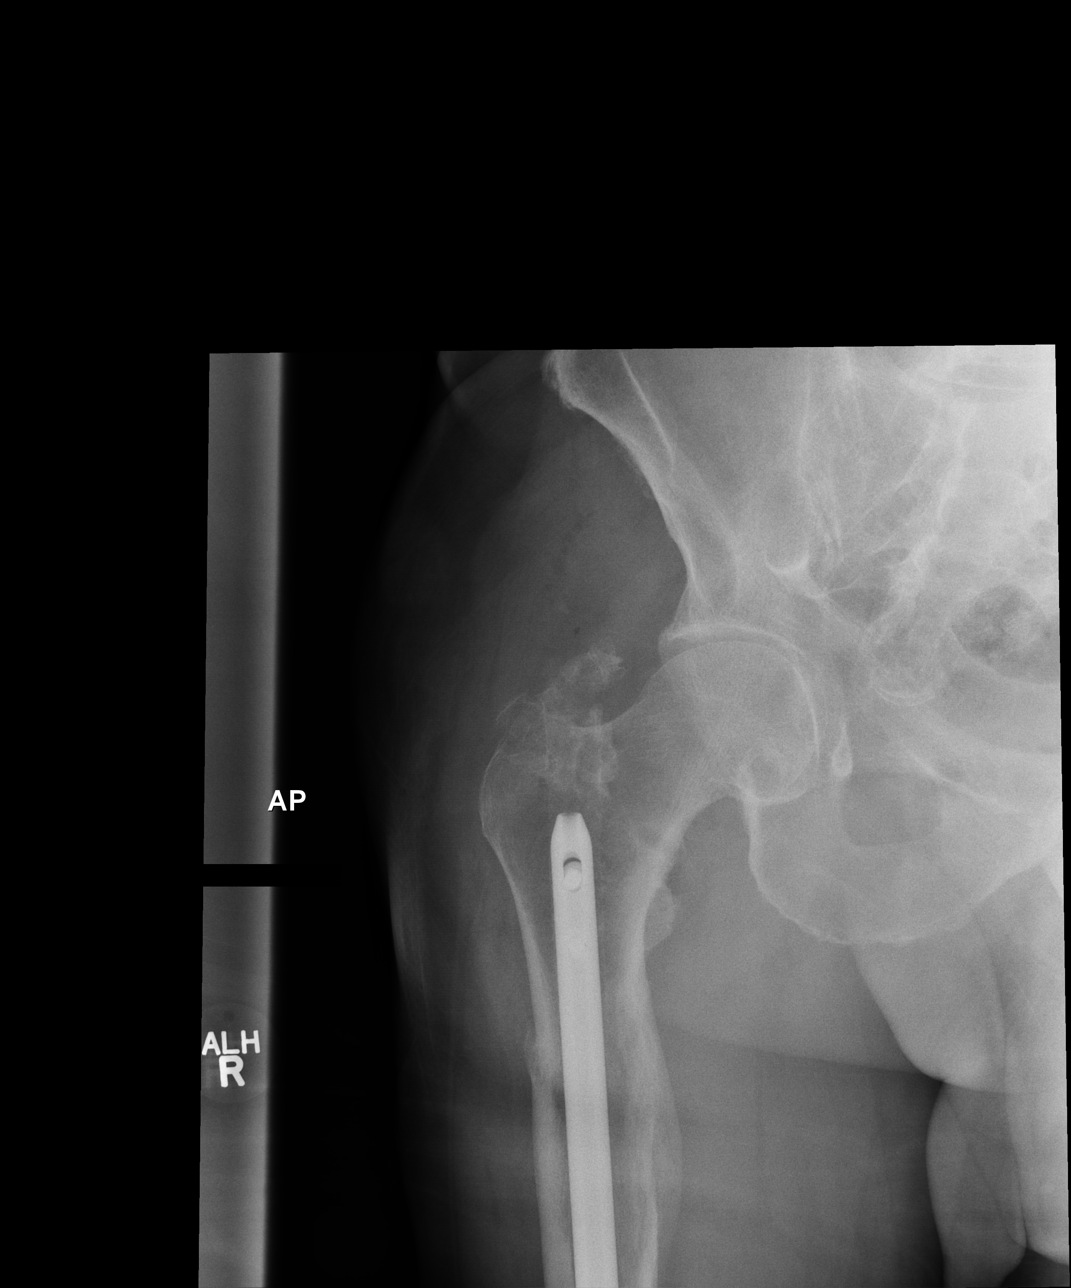

[AP (2 of 4)]
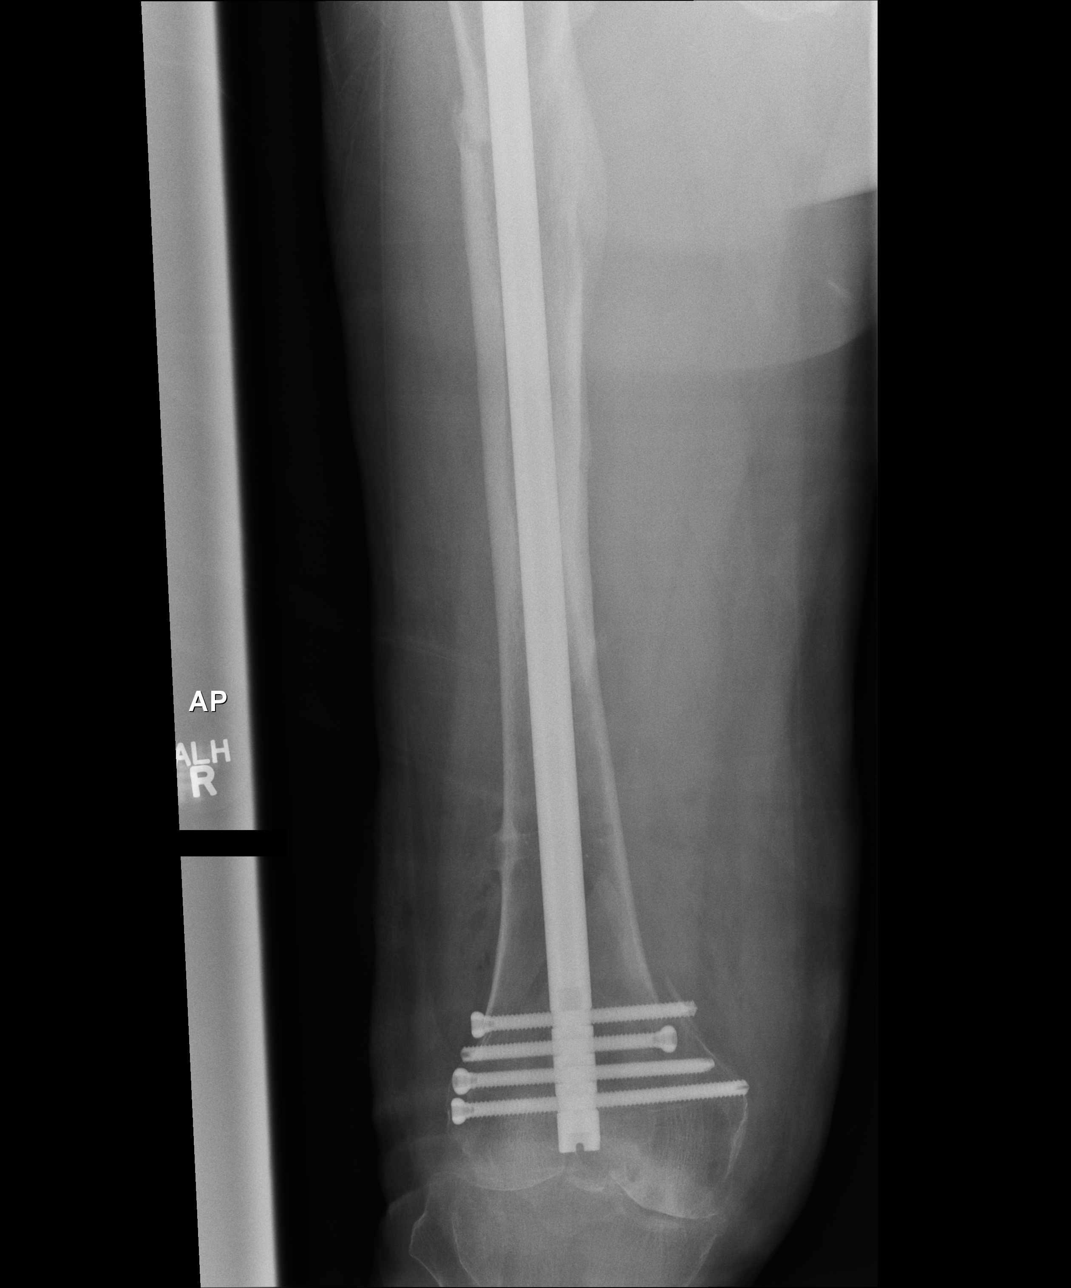

[AP (3 of 4)]
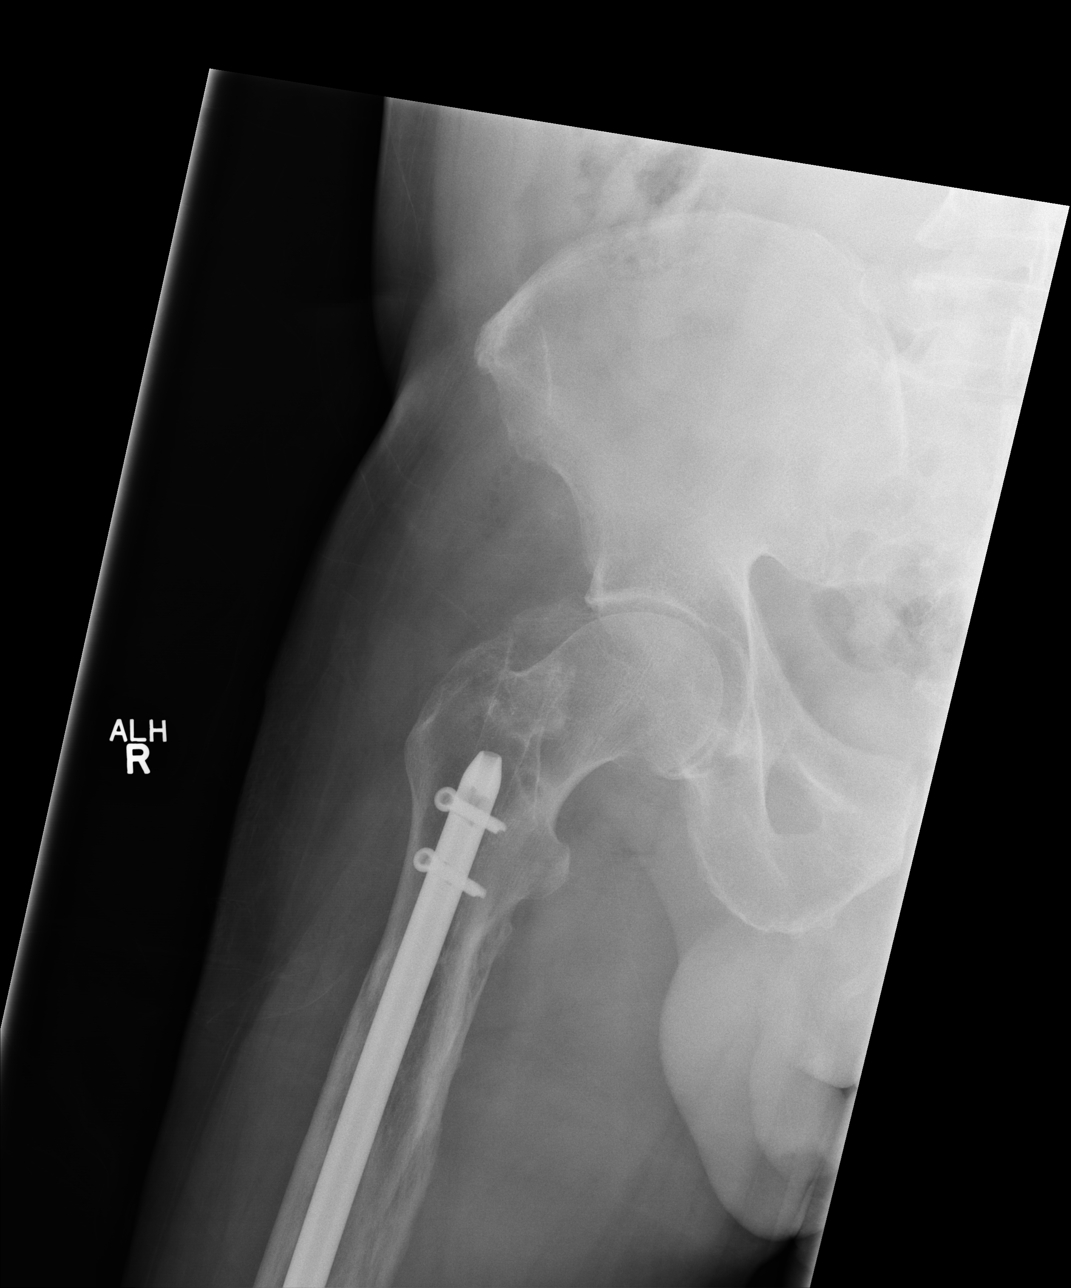

[AP (4 of 4)]
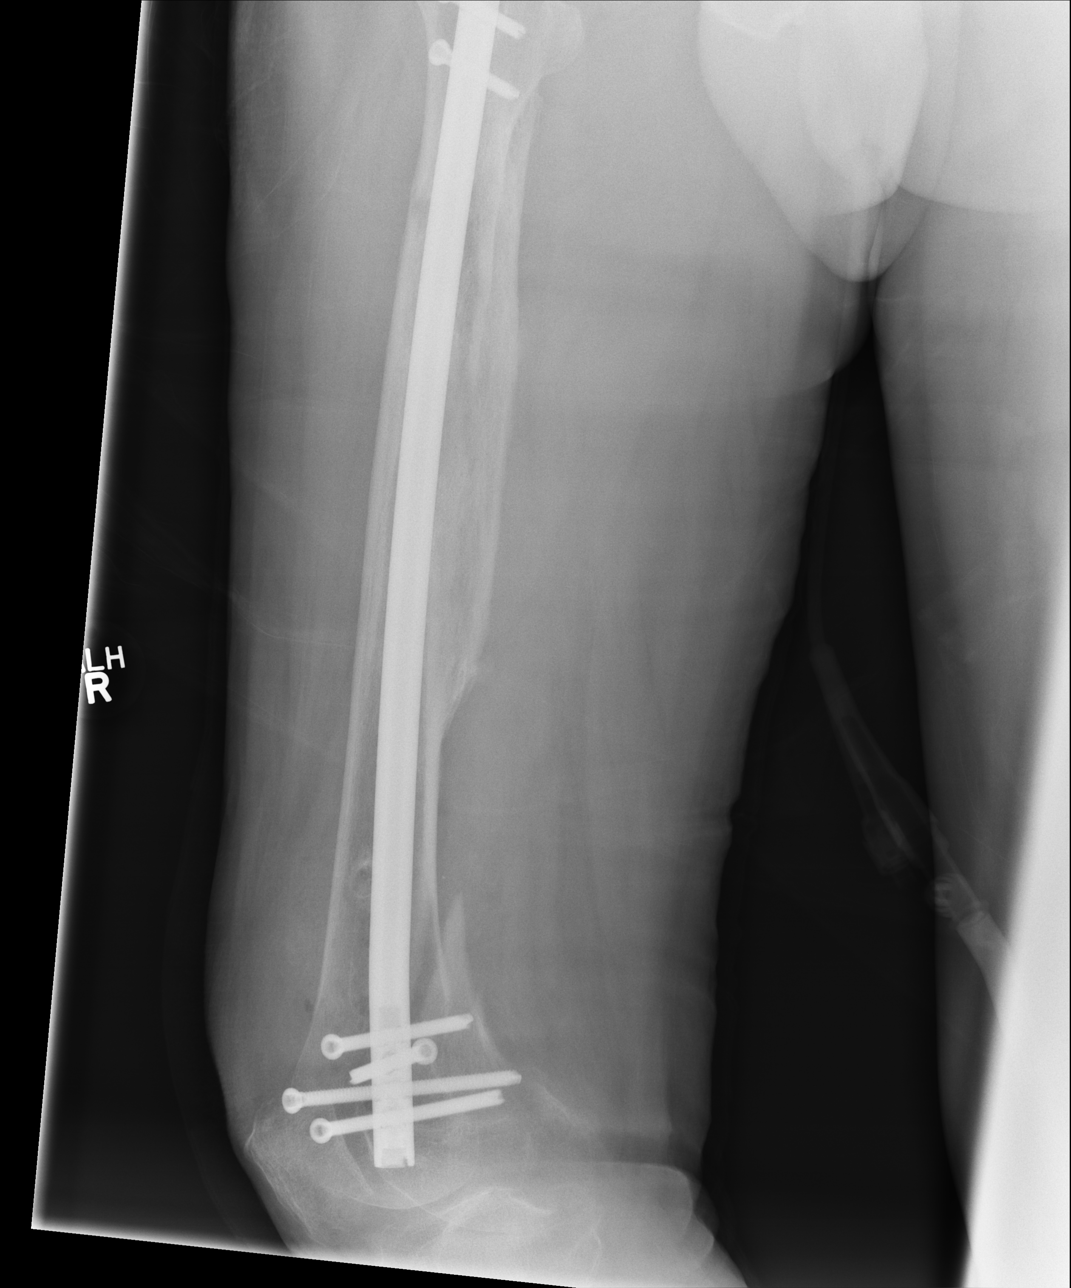

[4 of 4 positions shown; findings below may reference images not displayed]

FINDINGS: Mildly displaced distal femur diametaphyseal fracture fixed by an
intramedullary rod and multiple transverse threaded screws. Fracture
alignment and displacement is stable. Healed proximal femoral
diaphyseal fracture. Degenerative changes of the knee joint
partially visualized.
IMPRESSION: Mildly displaced distal femur diametaphyseal fracture fixed by an
intramedullary rod and multiple transverse threaded screws. Fracture
alignment and displacement is stable.

By: Dikdator Bilalzade M.D.
